# Patient Record
Sex: Female | Born: 1945 | Race: White | Hispanic: No | Marital: Married | Smoking: Never smoker
Health system: Southern US, Community
[De-identification: ages and names within clinical notes are randomized; demographics above are authoritative.]

## PROBLEM LIST (undated history)

## (undated) DIAGNOSIS — K219 Gastro-esophageal reflux disease without esophagitis: Secondary | ICD-10-CM

## (undated) DIAGNOSIS — L120 Bullous pemphigoid: Secondary | ICD-10-CM

## (undated) DIAGNOSIS — I10 Essential (primary) hypertension: Secondary | ICD-10-CM

## (undated) DIAGNOSIS — E0590 Thyrotoxicosis, unspecified without thyrotoxic crisis or storm: Secondary | ICD-10-CM

## (undated) DIAGNOSIS — R519 Headache, unspecified: Secondary | ICD-10-CM

## (undated) DIAGNOSIS — R112 Nausea with vomiting, unspecified: Secondary | ICD-10-CM

## (undated) DIAGNOSIS — Z9889 Other specified postprocedural states: Secondary | ICD-10-CM

## (undated) HISTORY — PX: NO PAST SURGERIES: SHX2092

---

## 1998-02-26 ENCOUNTER — Ambulatory Visit (HOSPITAL_COMMUNITY): Admission: RE | Admit: 1998-02-26 | Discharge: 1998-02-26 | Payer: Self-pay | Admitting: *Deleted

## 1998-06-16 ENCOUNTER — Other Ambulatory Visit: Admission: RE | Admit: 1998-06-16 | Discharge: 1998-06-16 | Payer: Self-pay | Admitting: *Deleted

## 1999-06-03 ENCOUNTER — Ambulatory Visit (HOSPITAL_COMMUNITY): Admission: RE | Admit: 1999-06-03 | Discharge: 1999-06-03 | Payer: Self-pay | Admitting: Obstetrics & Gynecology

## 1999-06-03 ENCOUNTER — Encounter: Payer: Self-pay | Admitting: Obstetrics & Gynecology

## 1999-06-16 ENCOUNTER — Other Ambulatory Visit: Admission: RE | Admit: 1999-06-16 | Discharge: 1999-06-16 | Payer: Self-pay | Admitting: Obstetrics & Gynecology

## 2000-06-20 ENCOUNTER — Encounter: Payer: Self-pay | Admitting: Emergency Medicine

## 2000-06-20 ENCOUNTER — Emergency Department (HOSPITAL_COMMUNITY): Admission: EM | Admit: 2000-06-20 | Discharge: 2000-06-20 | Payer: Self-pay | Admitting: Emergency Medicine

## 2000-06-30 ENCOUNTER — Other Ambulatory Visit: Admission: RE | Admit: 2000-06-30 | Discharge: 2000-06-30 | Payer: Self-pay | Admitting: Obstetrics & Gynecology

## 2000-08-01 ENCOUNTER — Ambulatory Visit (HOSPITAL_COMMUNITY): Admission: RE | Admit: 2000-08-01 | Discharge: 2000-08-01 | Payer: Self-pay | Admitting: Obstetrics & Gynecology

## 2000-08-01 ENCOUNTER — Encounter: Payer: Self-pay | Admitting: Obstetrics & Gynecology

## 2001-09-12 ENCOUNTER — Other Ambulatory Visit: Admission: RE | Admit: 2001-09-12 | Discharge: 2001-09-12 | Payer: Self-pay | Admitting: Obstetrics & Gynecology

## 2001-10-23 ENCOUNTER — Ambulatory Visit (HOSPITAL_COMMUNITY): Admission: RE | Admit: 2001-10-23 | Discharge: 2001-10-23 | Payer: Self-pay | Admitting: Obstetrics & Gynecology

## 2001-10-23 ENCOUNTER — Encounter: Payer: Self-pay | Admitting: Obstetrics & Gynecology

## 2002-10-12 ENCOUNTER — Other Ambulatory Visit: Admission: RE | Admit: 2002-10-12 | Discharge: 2002-10-12 | Payer: Self-pay | Admitting: Obstetrics & Gynecology

## 2003-01-03 ENCOUNTER — Encounter: Payer: Self-pay | Admitting: Obstetrics & Gynecology

## 2003-01-03 ENCOUNTER — Ambulatory Visit (HOSPITAL_COMMUNITY): Admission: RE | Admit: 2003-01-03 | Discharge: 2003-01-03 | Payer: Self-pay | Admitting: Obstetrics & Gynecology

## 2004-01-06 ENCOUNTER — Ambulatory Visit (HOSPITAL_COMMUNITY): Admission: RE | Admit: 2004-01-06 | Discharge: 2004-01-06 | Payer: Self-pay | Admitting: Obstetrics & Gynecology

## 2005-01-12 ENCOUNTER — Ambulatory Visit (HOSPITAL_COMMUNITY): Admission: RE | Admit: 2005-01-12 | Discharge: 2005-01-12 | Payer: Self-pay | Admitting: Obstetrics & Gynecology

## 2005-03-05 ENCOUNTER — Encounter: Admission: RE | Admit: 2005-03-05 | Discharge: 2005-03-05 | Payer: Self-pay | Admitting: Orthopedic Surgery

## 2006-01-14 ENCOUNTER — Ambulatory Visit (HOSPITAL_COMMUNITY): Admission: RE | Admit: 2006-01-14 | Discharge: 2006-01-14 | Payer: Self-pay | Admitting: Obstetrics & Gynecology

## 2007-01-18 ENCOUNTER — Ambulatory Visit: Admission: RE | Admit: 2007-01-18 | Discharge: 2007-01-18 | Payer: Self-pay | Admitting: Obstetrics & Gynecology

## 2008-01-24 ENCOUNTER — Ambulatory Visit (HOSPITAL_COMMUNITY): Admission: RE | Admit: 2008-01-24 | Discharge: 2008-01-24 | Payer: Self-pay | Admitting: Obstetrics & Gynecology

## 2009-01-24 ENCOUNTER — Ambulatory Visit (HOSPITAL_COMMUNITY): Admission: RE | Admit: 2009-01-24 | Discharge: 2009-01-24 | Payer: Self-pay | Admitting: Obstetrics & Gynecology

## 2009-02-04 ENCOUNTER — Encounter: Admission: RE | Admit: 2009-02-04 | Discharge: 2009-02-04 | Payer: Self-pay | Admitting: Obstetrics & Gynecology

## 2010-04-09 ENCOUNTER — Encounter
Admission: RE | Admit: 2010-04-09 | Discharge: 2010-04-09 | Payer: Self-pay | Source: Home / Self Care | Attending: Obstetrics & Gynecology | Admitting: Obstetrics & Gynecology

## 2011-04-20 ENCOUNTER — Other Ambulatory Visit: Payer: Self-pay | Admitting: Obstetrics & Gynecology

## 2011-04-20 DIAGNOSIS — Z1231 Encounter for screening mammogram for malignant neoplasm of breast: Secondary | ICD-10-CM

## 2011-05-05 ENCOUNTER — Ambulatory Visit
Admission: RE | Admit: 2011-05-05 | Discharge: 2011-05-05 | Disposition: A | Discharge status: Home / Self Care | Payer: BC Managed Care – PPO | Source: Ambulatory Visit | Attending: Obstetrics & Gynecology | Admitting: Obstetrics & Gynecology

## 2011-05-05 DIAGNOSIS — Z1231 Encounter for screening mammogram for malignant neoplasm of breast: Secondary | ICD-10-CM

## 2011-10-25 ENCOUNTER — Other Ambulatory Visit (HOSPITAL_COMMUNITY): Payer: Self-pay | Admitting: Family Medicine

## 2011-10-25 DIAGNOSIS — E0590 Thyrotoxicosis, unspecified without thyrotoxic crisis or storm: Secondary | ICD-10-CM

## 2011-11-02 ENCOUNTER — Encounter (HOSPITAL_COMMUNITY)
Admission: RE | Admit: 2011-11-02 | Discharge: 2011-11-02 | Disposition: A | Discharge status: Home / Self Care | Payer: BC Managed Care – PPO | Source: Ambulatory Visit | Attending: Family Medicine | Admitting: Family Medicine

## 2011-11-02 DIAGNOSIS — E0590 Thyrotoxicosis, unspecified without thyrotoxic crisis or storm: Secondary | ICD-10-CM | POA: Insufficient documentation

## 2011-11-02 DIAGNOSIS — G4700 Insomnia, unspecified: Secondary | ICD-10-CM | POA: Insufficient documentation

## 2011-11-02 DIAGNOSIS — R000 Tachycardia, unspecified: Secondary | ICD-10-CM | POA: Insufficient documentation

## 2011-11-03 ENCOUNTER — Encounter (HOSPITAL_COMMUNITY)
Admission: RE | Admit: 2011-11-03 | Discharge: 2011-11-03 | Disposition: A | Discharge status: Home / Self Care | Payer: BC Managed Care – PPO | Source: Ambulatory Visit | Attending: Family Medicine | Admitting: Family Medicine

## 2011-11-03 MED ORDER — SODIUM PERTECHNETATE TC 99M INJECTION
10.6000 | Freq: Once | INTRAVENOUS | Status: AC | PRN
  Administered 2011-11-03: 11 via INTRAVENOUS

## 2011-11-03 MED ORDER — SODIUM IODIDE I 131 CAPSULE
10.6000 | Freq: Once | INTRAVENOUS | Status: AC | PRN
  Administered 2011-11-03: 10.6 via ORAL

## 2012-01-05 ENCOUNTER — Other Ambulatory Visit: Payer: Self-pay | Admitting: Endocrinology

## 2012-01-05 DIAGNOSIS — E0590 Thyrotoxicosis, unspecified without thyrotoxic crisis or storm: Secondary | ICD-10-CM

## 2012-01-05 DIAGNOSIS — E042 Nontoxic multinodular goiter: Secondary | ICD-10-CM

## 2012-01-14 ENCOUNTER — Ambulatory Visit (HOSPITAL_COMMUNITY)
Admission: RE | Admit: 2012-01-14 | Discharge: 2012-01-14 | Disposition: A | Discharge status: Home / Self Care | Payer: BC Managed Care – PPO | Source: Ambulatory Visit | Attending: Endocrinology | Admitting: Endocrinology

## 2012-01-14 DIAGNOSIS — E0590 Thyrotoxicosis, unspecified without thyrotoxic crisis or storm: Secondary | ICD-10-CM | POA: Insufficient documentation

## 2012-01-14 DIAGNOSIS — E042 Nontoxic multinodular goiter: Secondary | ICD-10-CM | POA: Insufficient documentation

## 2012-04-24 DIAGNOSIS — E0590 Thyrotoxicosis, unspecified without thyrotoxic crisis or storm: Secondary | ICD-10-CM | POA: Diagnosis not present

## 2012-04-26 DIAGNOSIS — E042 Nontoxic multinodular goiter: Secondary | ICD-10-CM | POA: Diagnosis not present

## 2012-04-26 DIAGNOSIS — E0590 Thyrotoxicosis, unspecified without thyrotoxic crisis or storm: Secondary | ICD-10-CM | POA: Diagnosis not present

## 2012-05-22 DIAGNOSIS — L80 Vitiligo: Secondary | ICD-10-CM | POA: Diagnosis not present

## 2012-05-30 ENCOUNTER — Other Ambulatory Visit: Payer: Self-pay

## 2012-05-30 DIAGNOSIS — Z1231 Encounter for screening mammogram for malignant neoplasm of breast: Secondary | ICD-10-CM

## 2012-06-19 DIAGNOSIS — M79609 Pain in unspecified limb: Secondary | ICD-10-CM | POA: Diagnosis not present

## 2012-06-19 DIAGNOSIS — Z1331 Encounter for screening for depression: Secondary | ICD-10-CM | POA: Diagnosis not present

## 2012-06-19 DIAGNOSIS — I10 Essential (primary) hypertension: Secondary | ICD-10-CM | POA: Diagnosis not present

## 2012-06-20 ENCOUNTER — Ambulatory Visit
Admission: RE | Admit: 2012-06-20 | Discharge: 2012-06-20 | Disposition: A | Discharge status: Home / Self Care | Payer: Medicare Other | Source: Ambulatory Visit

## 2012-06-20 DIAGNOSIS — Z1231 Encounter for screening mammogram for malignant neoplasm of breast: Secondary | ICD-10-CM

## 2012-07-25 DIAGNOSIS — E0590 Thyrotoxicosis, unspecified without thyrotoxic crisis or storm: Secondary | ICD-10-CM | POA: Diagnosis not present

## 2012-12-06 DIAGNOSIS — E0590 Thyrotoxicosis, unspecified without thyrotoxic crisis or storm: Secondary | ICD-10-CM | POA: Diagnosis not present

## 2012-12-08 DIAGNOSIS — E042 Nontoxic multinodular goiter: Secondary | ICD-10-CM | POA: Diagnosis not present

## 2012-12-08 DIAGNOSIS — E0590 Thyrotoxicosis, unspecified without thyrotoxic crisis or storm: Secondary | ICD-10-CM | POA: Diagnosis not present

## 2012-12-08 DIAGNOSIS — E0500 Thyrotoxicosis with diffuse goiter without thyrotoxic crisis or storm: Secondary | ICD-10-CM | POA: Diagnosis not present

## 2012-12-19 DIAGNOSIS — J387 Other diseases of larynx: Secondary | ICD-10-CM | POA: Diagnosis not present

## 2012-12-19 DIAGNOSIS — Z23 Encounter for immunization: Secondary | ICD-10-CM | POA: Diagnosis not present

## 2012-12-19 DIAGNOSIS — I10 Essential (primary) hypertension: Secondary | ICD-10-CM | POA: Diagnosis not present

## 2012-12-19 DIAGNOSIS — Z733 Stress, not elsewhere classified: Secondary | ICD-10-CM | POA: Diagnosis not present

## 2012-12-19 DIAGNOSIS — Z1211 Encounter for screening for malignant neoplasm of colon: Secondary | ICD-10-CM | POA: Diagnosis not present

## 2013-02-09 DIAGNOSIS — Z124 Encounter for screening for malignant neoplasm of cervix: Secondary | ICD-10-CM | POA: Diagnosis not present

## 2013-02-09 DIAGNOSIS — Z01419 Encounter for gynecological examination (general) (routine) without abnormal findings: Secondary | ICD-10-CM | POA: Diagnosis not present

## 2013-02-12 DIAGNOSIS — R197 Diarrhea, unspecified: Secondary | ICD-10-CM | POA: Diagnosis not present

## 2013-02-12 DIAGNOSIS — K589 Irritable bowel syndrome without diarrhea: Secondary | ICD-10-CM | POA: Diagnosis not present

## 2013-02-12 DIAGNOSIS — K5289 Other specified noninfective gastroenteritis and colitis: Secondary | ICD-10-CM | POA: Diagnosis not present

## 2013-02-12 DIAGNOSIS — Z1211 Encounter for screening for malignant neoplasm of colon: Secondary | ICD-10-CM | POA: Diagnosis not present

## 2013-02-27 DIAGNOSIS — R197 Diarrhea, unspecified: Secondary | ICD-10-CM | POA: Diagnosis not present

## 2013-03-30 DIAGNOSIS — K5289 Other specified noninfective gastroenteritis and colitis: Secondary | ICD-10-CM | POA: Diagnosis not present

## 2013-04-09 DIAGNOSIS — E0590 Thyrotoxicosis, unspecified without thyrotoxic crisis or storm: Secondary | ICD-10-CM | POA: Diagnosis not present

## 2013-04-09 DIAGNOSIS — L282 Other prurigo: Secondary | ICD-10-CM | POA: Diagnosis not present

## 2013-04-25 DIAGNOSIS — B86 Scabies: Secondary | ICD-10-CM | POA: Diagnosis not present

## 2013-05-25 DIAGNOSIS — H268 Other specified cataract: Secondary | ICD-10-CM | POA: Diagnosis not present

## 2013-05-25 DIAGNOSIS — H2510 Age-related nuclear cataract, unspecified eye: Secondary | ICD-10-CM | POA: Diagnosis not present

## 2013-05-25 DIAGNOSIS — H16429 Pannus (corneal), unspecified eye: Secondary | ICD-10-CM | POA: Diagnosis not present

## 2013-05-25 DIAGNOSIS — H18419 Arcus senilis, unspecified eye: Secondary | ICD-10-CM | POA: Diagnosis not present

## 2013-07-04 DIAGNOSIS — I10 Essential (primary) hypertension: Secondary | ICD-10-CM | POA: Diagnosis not present

## 2013-07-04 DIAGNOSIS — G43909 Migraine, unspecified, not intractable, without status migrainosus: Secondary | ICD-10-CM | POA: Diagnosis not present

## 2013-07-04 DIAGNOSIS — Z0000 Encounter for general adult medical examination without abnormal findings: Secondary | ICD-10-CM | POA: Diagnosis not present

## 2013-07-04 DIAGNOSIS — K219 Gastro-esophageal reflux disease without esophagitis: Secondary | ICD-10-CM | POA: Diagnosis not present

## 2013-07-19 ENCOUNTER — Other Ambulatory Visit: Payer: Self-pay | Admitting: Family Medicine

## 2013-07-19 ENCOUNTER — Ambulatory Visit
Admission: RE | Admit: 2013-07-19 | Discharge: 2013-07-19 | Disposition: A | Discharge status: Home / Self Care | Payer: Medicare Other | Source: Ambulatory Visit | Attending: Family Medicine | Admitting: Family Medicine

## 2013-07-19 ENCOUNTER — Emergency Department (HOSPITAL_COMMUNITY): Payer: Medicare Other | Admitting: Certified Registered Nurse Anesthetist

## 2013-07-19 ENCOUNTER — Encounter (HOSPITAL_COMMUNITY): Payer: Self-pay | Admitting: Emergency Medicine

## 2013-07-19 ENCOUNTER — Encounter (HOSPITAL_COMMUNITY)
Admission: EM | Disposition: A | Discharge status: Home / Self Care | Payer: Self-pay | Source: Home / Self Care | Attending: Surgery

## 2013-07-19 ENCOUNTER — Encounter (HOSPITAL_COMMUNITY): Payer: Medicare Other | Admitting: Certified Registered Nurse Anesthetist

## 2013-07-19 ENCOUNTER — Inpatient Hospital Stay (HOSPITAL_COMMUNITY)
Admission: EM | Admit: 2013-07-19 | Discharge: 2013-07-21 | DRG: 340 | Disposition: A | Discharge status: Home / Self Care | Payer: Medicare Other | Attending: Surgery | Admitting: Surgery

## 2013-07-19 DIAGNOSIS — K3532 Acute appendicitis with perforation and localized peritonitis, without abscess: Secondary | ICD-10-CM | POA: Diagnosis present

## 2013-07-19 DIAGNOSIS — K37 Unspecified appendicitis: Secondary | ICD-10-CM

## 2013-07-19 DIAGNOSIS — Z881 Allergy status to other antibiotic agents status: Secondary | ICD-10-CM | POA: Diagnosis not present

## 2013-07-19 DIAGNOSIS — G43909 Migraine, unspecified, not intractable, without status migrainosus: Secondary | ICD-10-CM | POA: Diagnosis not present

## 2013-07-19 DIAGNOSIS — K35209 Acute appendicitis with generalized peritonitis, without abscess, unspecified as to perforation: Principal | ICD-10-CM | POA: Diagnosis present

## 2013-07-19 DIAGNOSIS — K219 Gastro-esophageal reflux disease without esophagitis: Secondary | ICD-10-CM | POA: Diagnosis present

## 2013-07-19 DIAGNOSIS — K3580 Unspecified acute appendicitis: Secondary | ICD-10-CM | POA: Diagnosis present

## 2013-07-19 DIAGNOSIS — R10819 Abdominal tenderness, unspecified site: Secondary | ICD-10-CM | POA: Diagnosis not present

## 2013-07-19 DIAGNOSIS — I10 Essential (primary) hypertension: Secondary | ICD-10-CM | POA: Diagnosis present

## 2013-07-19 DIAGNOSIS — R1031 Right lower quadrant pain: Secondary | ICD-10-CM | POA: Diagnosis not present

## 2013-07-19 DIAGNOSIS — Z79899 Other long term (current) drug therapy: Secondary | ICD-10-CM | POA: Diagnosis not present

## 2013-07-19 DIAGNOSIS — K352 Acute appendicitis with generalized peritonitis, without abscess: Secondary | ICD-10-CM | POA: Diagnosis not present

## 2013-07-19 DIAGNOSIS — R109 Unspecified abdominal pain: Secondary | ICD-10-CM | POA: Diagnosis not present

## 2013-07-19 DIAGNOSIS — E0590 Thyrotoxicosis, unspecified without thyrotoxic crisis or storm: Secondary | ICD-10-CM | POA: Diagnosis not present

## 2013-07-19 DIAGNOSIS — E039 Hypothyroidism, unspecified: Secondary | ICD-10-CM | POA: Diagnosis not present

## 2013-07-19 DIAGNOSIS — K3533 Acute appendicitis with perforation and localized peritonitis, with abscess: Secondary | ICD-10-CM | POA: Diagnosis present

## 2013-07-19 HISTORY — PX: LAPAROSCOPIC APPENDECTOMY: SHX408

## 2013-07-19 HISTORY — DX: Essential (primary) hypertension: I10

## 2013-07-19 HISTORY — DX: Thyrotoxicosis, unspecified without thyrotoxic crisis or storm: E05.90

## 2013-07-19 LAB — COMPREHENSIVE METABOLIC PANEL
ALT: 74 U/L — ABNORMAL HIGH (ref 0–35)
AST: 86 U/L — ABNORMAL HIGH (ref 0–37)
Albumin: 3.9 g/dL (ref 3.5–5.2)
Alkaline Phosphatase: 104 U/L (ref 39–117)
BILIRUBIN TOTAL: 2.1 mg/dL — AB (ref 0.3–1.2)
BUN: 9 mg/dL (ref 6–23)
CHLORIDE: 89 meq/L — AB (ref 96–112)
CO2: 25 meq/L (ref 19–32)
CREATININE: 0.63 mg/dL (ref 0.50–1.10)
Calcium: 9.7 mg/dL (ref 8.4–10.5)
GFR calc non Af Amer: >90 mL/min (ref >90)
GLUCOSE: 125 mg/dL — AB (ref 70–99)
Potassium: 4.3 mEq/L (ref 3.7–5.3)
Sodium: 131 mEq/L — ABNORMAL LOW (ref 137–147)
Total Protein: 7.7 g/dL (ref 6.0–8.3)

## 2013-07-19 LAB — CBC WITH DIFFERENTIAL/PLATELET
Basophils Absolute: 0 10*3/uL (ref 0.0–0.1)
Basophils Relative: 0 % (ref 0–1)
Eosinophils Absolute: 0 10*3/uL (ref 0.0–0.7)
Eosinophils Relative: 0 % (ref 0–5)
HEMATOCRIT: 43.7 % (ref 36.0–46.0)
HEMOGLOBIN: 15.1 g/dL — AB (ref 12.0–15.0)
Lymphocytes Relative: 3 % — ABNORMAL LOW (ref 12–46)
Lymphs Abs: 0.6 10*3/uL — ABNORMAL LOW (ref 0.7–4.0)
MCH: 32.1 pg (ref 26.0–34.0)
MCHC: 34.6 g/dL (ref 30.0–36.0)
MCV: 93 fL (ref 78.0–100.0)
MONO ABS: 1.4 10*3/uL — AB (ref 0.1–1.0)
MONOS PCT: 7 % (ref 3–12)
Neutro Abs: 17.4 10*3/uL — ABNORMAL HIGH (ref 1.7–7.7)
Neutrophils Relative %: 90 % — ABNORMAL HIGH (ref 43–77)
Platelets: 274 10*3/uL (ref 150–400)
RBC: 4.7 MIL/uL (ref 3.87–5.11)
RDW: 12.7 % (ref 11.5–15.5)
WBC: 19.5 10*3/uL — ABNORMAL HIGH (ref 4.0–10.5)

## 2013-07-19 SURGERY — APPENDECTOMY, LAPAROSCOPIC
Anesthesia: General | Site: Abdomen

## 2013-07-19 MED ORDER — BUPIVACAINE-EPINEPHRINE (PF) 0.25% -1:200000 IJ SOLN
INTRAMUSCULAR | Status: AC
  Filled 2013-07-19: qty 30

## 2013-07-19 MED ORDER — SODIUM CHLORIDE 0.9 % IV SOLN
3.0000 g | Freq: Four times a day (QID) | INTRAVENOUS | Status: DC
  Administered 2013-07-19 – 2013-07-21 (×6): 3 g via INTRAVENOUS
  Filled 2013-07-19 (×9): qty 3

## 2013-07-19 MED ORDER — FENTANYL CITRATE 0.05 MG/ML IJ SOLN
INTRAMUSCULAR | Status: AC
  Filled 2013-07-19: qty 5

## 2013-07-19 MED ORDER — ENOXAPARIN SODIUM 40 MG/0.4ML ~~LOC~~ SOLN
40.0000 mg | SUBCUTANEOUS | Status: DC
  Administered 2013-07-20: 40 mg via SUBCUTANEOUS
  Filled 2013-07-19 (×2): qty 0.4

## 2013-07-19 MED ORDER — GLYCOPYRROLATE 0.2 MG/ML IJ SOLN
INTRAMUSCULAR | Status: AC
  Filled 2013-07-19: qty 2

## 2013-07-19 MED ORDER — LACTATED RINGERS IR SOLN
Status: DC | PRN
  Administered 2013-07-19: 1000 mL

## 2013-07-19 MED ORDER — SUCCINYLCHOLINE CHLORIDE 20 MG/ML IJ SOLN
INTRAMUSCULAR | Status: DC | PRN
  Administered 2013-07-19: 80 mg via INTRAVENOUS

## 2013-07-19 MED ORDER — POTASSIUM CHLORIDE IN NACL 20-0.9 MEQ/L-% IV SOLN
INTRAVENOUS | Status: DC
  Filled 2013-07-19 (×2): qty 1000

## 2013-07-19 MED ORDER — LIDOCAINE HCL (CARDIAC) 20 MG/ML IV SOLN
INTRAVENOUS | Status: AC
  Filled 2013-07-19: qty 5

## 2013-07-19 MED ORDER — SODIUM CHLORIDE 0.9 % IV SOLN
3.0000 g | INTRAVENOUS | Status: AC
  Administered 2013-07-19: 3 g via INTRAVENOUS
  Filled 2013-07-19: qty 3

## 2013-07-19 MED ORDER — DEXAMETHASONE SODIUM PHOSPHATE 10 MG/ML IJ SOLN
INTRAMUSCULAR | Status: DC | PRN
  Administered 2013-07-19: 10 mg via INTRAVENOUS

## 2013-07-19 MED ORDER — BUPIVACAINE-EPINEPHRINE 0.25% -1:200000 IJ SOLN
INTRAMUSCULAR | Status: DC | PRN
  Administered 2013-07-19: 20 mL

## 2013-07-19 MED ORDER — MIDAZOLAM HCL 2 MG/2ML IJ SOLN
INTRAMUSCULAR | Status: AC
  Filled 2013-07-19: qty 2

## 2013-07-19 MED ORDER — MIDAZOLAM HCL 5 MG/5ML IJ SOLN
INTRAMUSCULAR | Status: DC | PRN
  Administered 2013-07-19: 2 mg via INTRAVENOUS

## 2013-07-19 MED ORDER — ONDANSETRON HCL 4 MG/2ML IJ SOLN
INTRAMUSCULAR | Status: AC
  Filled 2013-07-19: qty 2

## 2013-07-19 MED ORDER — CISATRACURIUM BESYLATE (PF) 10 MG/5ML IV SOLN
INTRAVENOUS | Status: DC | PRN
  Administered 2013-07-19: 4 mg via INTRAVENOUS

## 2013-07-19 MED ORDER — PROPOFOL 10 MG/ML IV BOLUS
INTRAVENOUS | Status: DC | PRN
  Administered 2013-07-19: 120 mg via INTRAVENOUS

## 2013-07-19 MED ORDER — ONDANSETRON HCL 4 MG/2ML IJ SOLN
INTRAMUSCULAR | Status: DC | PRN
  Administered 2013-07-19: 4 mg via INTRAVENOUS

## 2013-07-19 MED ORDER — HYDROMORPHONE HCL PF 1 MG/ML IJ SOLN: 1.0000 mg | INTRAMUSCULAR | Status: DC | PRN

## 2013-07-19 MED ORDER — FENTANYL CITRATE 0.05 MG/ML IJ SOLN
INTRAMUSCULAR | Status: DC | PRN
  Administered 2013-07-19 (×2): 50 ug via INTRAVENOUS
  Administered 2013-07-19: 100 ug via INTRAVENOUS
  Administered 2013-07-19: 50 ug via INTRAVENOUS

## 2013-07-19 MED ORDER — 0.9 % SODIUM CHLORIDE (POUR BTL) OPTIME
TOPICAL | Status: DC | PRN
  Administered 2013-07-19: 1000 mL

## 2013-07-19 MED ORDER — IOHEXOL 300 MG/ML  SOLN
100.0000 mL | Freq: Once | INTRAMUSCULAR | Status: AC | PRN
  Administered 2013-07-19: 100 mL via INTRAVENOUS

## 2013-07-19 MED ORDER — IBUPROFEN 600 MG PO TABS
600.0000 mg | ORAL_TABLET | Freq: Four times a day (QID) | ORAL | Status: DC | PRN
  Administered 2013-07-21: 600 mg via ORAL
  Filled 2013-07-19 (×2): qty 1

## 2013-07-19 MED ORDER — ONDANSETRON HCL 4 MG/2ML IJ SOLN
4.0000 mg | Freq: Once | INTRAMUSCULAR | Status: AC
  Administered 2013-07-19: 4 mg via INTRAVENOUS
  Filled 2013-07-19: qty 2

## 2013-07-19 MED ORDER — LIDOCAINE HCL (PF) 2 % IJ SOLN
INTRAMUSCULAR | Status: DC | PRN
  Administered 2013-07-19: 50 mg via INTRADERMAL

## 2013-07-19 MED ORDER — HYDROCODONE-ACETAMINOPHEN 5-325 MG PO TABS
1.0000 | ORAL_TABLET | ORAL | Status: DC | PRN
  Administered 2013-07-20 (×3): 1 via ORAL
  Administered 2013-07-20 – 2013-07-21 (×2): 2 via ORAL
  Filled 2013-07-19: qty 2
  Filled 2013-07-19 (×3): qty 1
  Filled 2013-07-19: qty 2
  Filled 2013-07-19: qty 1

## 2013-07-19 MED ORDER — HYDROMORPHONE HCL PF 1 MG/ML IJ SOLN
1.0000 mg | INTRAMUSCULAR | Status: DC | PRN
  Administered 2013-07-19: 1 mg via INTRAVENOUS
  Filled 2013-07-19: qty 1

## 2013-07-19 MED ORDER — GLYCOPYRROLATE 0.2 MG/ML IJ SOLN
INTRAMUSCULAR | Status: DC | PRN
  Administered 2013-07-19: 0.4 mg via INTRAVENOUS

## 2013-07-19 MED ORDER — CISATRACURIUM BESYLATE 20 MG/10ML IV SOLN
INTRAVENOUS | Status: AC
  Filled 2013-07-19: qty 10

## 2013-07-19 MED ORDER — SODIUM CHLORIDE 0.9 % IV SOLN
1000.0000 mL | Freq: Once | INTRAVENOUS | Status: AC
  Administered 2013-07-19: 1000 mL via INTRAVENOUS

## 2013-07-19 MED ORDER — PROPOFOL 10 MG/ML IV BOLUS
INTRAVENOUS | Status: AC
  Filled 2013-07-19: qty 20

## 2013-07-19 MED ORDER — NEOSTIGMINE METHYLSULFATE 10 MG/10ML IV SOLN
INTRAVENOUS | Status: DC | PRN
  Administered 2013-07-19: 3 mg via INTRAVENOUS

## 2013-07-19 MED ORDER — KCL IN DEXTROSE-NACL 30-5-0.45 MEQ/L-%-% IV SOLN
INTRAVENOUS | Status: DC
  Administered 2013-07-19: 75 mL via INTRAVENOUS
  Filled 2013-07-19 (×4): qty 1000

## 2013-07-19 MED ORDER — ONDANSETRON HCL 4 MG PO TABS
4.0000 mg | ORAL_TABLET | Freq: Four times a day (QID) | ORAL | Status: DC | PRN
  Administered 2013-07-20 – 2013-07-21 (×2): 4 mg via ORAL
  Filled 2013-07-19 (×2): qty 1

## 2013-07-19 MED ORDER — ONDANSETRON HCL 4 MG/2ML IJ SOLN: 4.0000 mg | Freq: Four times a day (QID) | INTRAMUSCULAR | Status: DC | PRN

## 2013-07-19 MED ORDER — HYDROMORPHONE HCL PF 1 MG/ML IJ SOLN
1.0000 mg | Freq: Once | INTRAMUSCULAR | Status: AC
  Administered 2013-07-19: 1 mg via INTRAVENOUS
  Filled 2013-07-19: qty 1

## 2013-07-19 MED ORDER — DEXAMETHASONE SODIUM PHOSPHATE 10 MG/ML IJ SOLN
INTRAMUSCULAR | Status: AC
  Filled 2013-07-19: qty 1

## 2013-07-19 MED ORDER — LACTATED RINGERS IV SOLN
INTRAVENOUS | Status: DC | PRN
  Administered 2013-07-19: 17:00:00 via INTRAVENOUS

## 2013-07-19 MED ORDER — ONDANSETRON HCL 4 MG/2ML IJ SOLN
4.0000 mg | Freq: Four times a day (QID) | INTRAMUSCULAR | Status: DC | PRN
  Administered 2013-07-19: 4 mg via INTRAVENOUS
  Filled 2013-07-19: qty 2

## 2013-07-19 SURGICAL SUPPLY — 40 items
APL SKNCLS STERI-STRIP NONHPOA (GAUZE/BANDAGES/DRESSINGS) ×1
APPLIER CLIP ROT 10 11.4 M/L (STAPLE)
APR CLP MED LRG 11.4X10 (STAPLE)
BAG SPEC RTRVL LRG 6X4 10 (ENDOMECHANICALS) ×1
BENZOIN TINCTURE PRP APPL 2/3 (GAUZE/BANDAGES/DRESSINGS) ×3 IMPLANT
CANISTER SUCTION 2500CC (MISCELLANEOUS) ×3 IMPLANT
CLIP APPLIE ROT 10 11.4 M/L (STAPLE) IMPLANT
CLOSURE WOUND 1/2 X4 (GAUZE/BANDAGES/DRESSINGS) ×1
CUTTER FLEX LINEAR 45M (STAPLE) ×2 IMPLANT
DECANTER SPIKE VIAL GLASS SM (MISCELLANEOUS) ×1 IMPLANT
DRAPE LAPAROSCOPIC ABDOMINAL (DRAPES) ×3 IMPLANT
ELECT REM PT RETURN 9FT ADLT (ELECTROSURGICAL) ×3
ELECTRODE REM PT RTRN 9FT ADLT (ELECTROSURGICAL) ×1 IMPLANT
ENDOLOOP SUT PDS II  0 18 (SUTURE)
ENDOLOOP SUT PDS II 0 18 (SUTURE) IMPLANT
GLOVE BIOGEL PI IND STRL 7.0 (GLOVE) ×1 IMPLANT
GLOVE BIOGEL PI INDICATOR 7.0 (GLOVE) ×2
GLOVE SURG ORTHO 8.0 STRL STRW (GLOVE) ×3 IMPLANT
GOWN STRL REUS W/TWL LRG LVL3 (GOWN DISPOSABLE) ×3 IMPLANT
GOWN STRL REUS W/TWL XL LVL3 (GOWN DISPOSABLE) ×6 IMPLANT
KIT BASIN OR (CUSTOM PROCEDURE TRAY) ×3 IMPLANT
PENCIL BUTTON HOLSTER BLD 10FT (ELECTRODE) IMPLANT
POUCH SPECIMEN RETRIEVAL 10MM (ENDOMECHANICALS) ×2 IMPLANT
RELOAD 45 VASCULAR/THIN (ENDOMECHANICALS) IMPLANT
RELOAD STAPLE 45 2.5 WHT GRN (ENDOMECHANICALS) IMPLANT
RELOAD STAPLE 45 3.5 BLU ETS (ENDOMECHANICALS) IMPLANT
RELOAD STAPLE TA45 3.5 REG BLU (ENDOMECHANICALS) ×3 IMPLANT
SCALPEL HARMONIC ACE (MISCELLANEOUS) ×2 IMPLANT
SET IRRIG TUBING LAPAROSCOPIC (IRRIGATION / IRRIGATOR) ×2 IMPLANT
SOLUTION ANTI FOG 6CC (MISCELLANEOUS) ×3 IMPLANT
STRIP CLOSURE SKIN 1/2X4 (GAUZE/BANDAGES/DRESSINGS) ×2 IMPLANT
SUT MNCRL AB 4-0 PS2 18 (SUTURE) ×3 IMPLANT
TOWEL OR 17X26 10 PK STRL BLUE (TOWEL DISPOSABLE) ×3 IMPLANT
TRAY FOLEY CATH 14FRSI W/METER (CATHETERS) ×3 IMPLANT
TRAY LAP CHOLE (CUSTOM PROCEDURE TRAY) ×3 IMPLANT
TROCAR BLADELESS OPT 5 75 (ENDOMECHANICALS) ×3 IMPLANT
TROCAR XCEL BLUNT TIP 100MML (ENDOMECHANICALS) ×3 IMPLANT
TROCAR XCEL NON-BLD 11X100MML (ENDOMECHANICALS) ×3 IMPLANT
TUBING INSUFFLATION 10FT LAP (TUBING) ×3 IMPLANT
WATER STERILE IRR 1500ML POUR (IV SOLUTION) ×3 IMPLANT

## 2013-07-19 NOTE — Anesthesia Preprocedure Evaluation (Signed)
Anesthesia Evaluation  Patient identified by MRN, date of birth, ID band Patient awake    Reviewed: Allergy & Precautions, H&P , NPO status , Patient's Chart, lab work & pertinent test results  Airway Mallampati: II TM Distance: >3 FB Neck ROM: Full    Dental no notable dental hx.    Pulmonary neg pulmonary ROS,  breath sounds clear to auscultation  Pulmonary exam normal       Cardiovascular Exercise Tolerance: Good hypertension, Pt. on medications Rhythm:Regular Rate:Normal     Neuro/Psych negative neurological ROS  negative psych ROS   GI/Hepatic negative GI ROS, Neg liver ROS,   Endo/Other  Hyperthyroidism   Renal/GU negative Renal ROS  negative genitourinary   Musculoskeletal negative musculoskeletal ROS (+)   Abdominal   Peds negative pediatric ROS (+)  Hematology negative hematology ROS (+)   Anesthesia Other Findings   Reproductive/Obstetrics negative OB ROS                           Anesthesia Physical Anesthesia Plan  ASA: II and emergent  Anesthesia Plan: General   Post-op Pain Management:    Induction: Intravenous  Airway Management Planned: Oral ETT  Additional Equipment:   Intra-op Plan:   Post-operative Plan: Extubation in OR  Informed Consent: I have reviewed the patients History and Physical, chart, labs and discussed the procedure including the risks, benefits and alternatives for the proposed anesthesia with the patient or authorized representative who has indicated his/her understanding and acceptance.   Dental advisory given  Plan Discussed with: CRNA  Anesthesia Plan Comments:         Anesthesia Quick Evaluation

## 2013-07-19 NOTE — Transfer of Care (Signed)
Immediate Anesthesia Transfer of Care Note  Patient: Julia Velez  Procedure(s) Performed: Procedure(s) (LRB): APPENDECTOMY LAPAROSCOPIC (N/A)  Patient Location: PACU  Anesthesia Type: General  Level of Consciousness: sedated, patient cooperative and responds to stimulation  Airway & Oxygen Therapy: Patient Spontanous Breathing and Patient connected to face mask oxgen  Post-op Assessment: Report given to PACU RN and Post -op Vital signs reviewed and stable  Post vital signs: Reviewed and stable  Complications: No apparent anesthesia complications

## 2013-07-19 NOTE — Op Note (Signed)
OPERATIVE REPORT - LAPAROSCOPIC APPENDECTOMY  Preop diagnosis: Acute appendicitis  Postop diagnosis: Acute appendicitis with perforation and peritonitis   Procedure: Laparoscopic appendectomy  Surgeon:  Earnstine Regal, MD, FACS  Anesthesia: General endotracheal  Estimated blood loss: Minimal  Preparation: Chlora-prep  Complications: None  Indications:  Patient is a 68 yo WF who is referred by her primary physician, Dr. Carita Pian, with signs and symptoms of appendicitis.  CT scan positive for appendicitis.  WBC elevated at 19K.  Now for appendectomy.  Procedure:  Patient is brought to the operating room and placed in a supine position on the operating room table. Following administration of general anesthesia, a time out was held and the patient's name and procedure is confirmed. Patient is then prepped and draped in the usual strict aseptic fashion.  After ascertaining that an adequate level of anesthesia has been achieved, a peri-umbilical incision is made with a #15 blade. Dissection is carried down to the fascia. Fascia is incised in the midline and the peritoneal cavity is entered cautiously. A #0-vicryl pursestring suture is placed in the fascia. An Hassan cannula is introduced under direct vision and secured with the pursestring suture. The abdomen is insufflated with carbon dioxide. The laparoscope is introduced and the abdomen is explored. Operative ports are placed in the right upper quadrant and left lower quadrant. The appendix is identified. It is adherent to the mesentery of the terminal ileum.  There is gross perforation with purulent fluid present adjacent to the appendix in the right lower quadrant.  There is peritoneal inflammation. The mesoappendix is divided with the harmonic scalpel. Dissection is carried down to the base of the appendix. The base of the appendix is dissected out clearing the junction with the cecal wall. Using an Endo-GIA stapler, the base of the  appendix is transected at the junction with the cecal wall. There is good approximation of tissue along the staple line. There is good hemostasis along the staple line. The appendix is placed into an endo-catch bag and withdrawn through the umbilical port. The #0-vicryl pursestring suture is tied securely.  Right lower quadrant is irrigated with warm saline which is evacuated. Good hemostasis is noted. Ports are removed under direct vision. Good hemostasis is noted at the port sites. Pneumoperitoneum is released.  Skin incisions are anesthetized with local anesthetic. Wounds are closed with interrupted 4-0 Monocryl subcuticular sutures. Wounds are washed and dried and benzoin and Steri-Strips are applied. Dressings are applied. The patient is awakened from anesthesia and brought to the recovery room. The patient tolerated the procedure well.  Earnstine Regal, MD, [facility] Office: [phone]

## 2013-07-19 NOTE — Consult Note (Signed)
Chief complaint:  Abdominal pain Referring Physician: Vidal Schwalbe, MD   Julia Velez is an 68 y.o. female.  HPI: Healthy female who started having pain last night about 5:30 PM.  Pain lasted all night and she could not get comfort from anything .  She saw her PCP this Am and CT scan shows acute appendicitis.  She was referred to the [facility].  Labs show elevated WBC 19.5.  CMP is pending.  She has been seen by Dr. Harlow Asa and we plan appendectomy this evening.  Past Medical History  Diagnosis Date  Hypertension   Migraine   GERD   Hyperthyroidism     Past Surgical History  Procedure Laterality Date  . No past surgeries      History reviewed. No pertinent family history.  Social History:  reports that she has never smoked. She has never used smokeless tobacco. She reports that she drinks alcohol. She reports that she does not use illicit drugs.  Allergies:  Allergies  Allergen Reactions  . 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 Sulfa Antibiotics Rash    Medications:  Prior to Admission medications   Medication Sig Start Date End Date Taking? Authorizing Provider  calcium carbonate (OS-CAL - DOSED IN MG OF ELEMENTAL CALCIUM) 1250 MG tablet Take 1 tablet by mouth daily with breakfast.   Yes Historical Provider, MD  cholecalciferol (VITAMIN D) 1000 UNITS tablet Take 1,000 Units by mouth daily.   Yes Historical Provider, MD  Cinnamon 500 MG capsule Take 500 mg by mouth daily.   Yes Historical Provider, MD  ranitidine (ZANTAC) 150 MG tablet Take 150 mg by mouth every morning.   Yes Historical Provider, MD  SUMAtriptan (IMITREX) 25 MG tablet Take 25 mg by mouth every 2 (two) hours as needed for migraine or headache. May repeat in 2 hours if headache persists or recurs.   Yes Historical Provider, MD  triamterene-hydrochlorothiazide (MAXZIDE-25) 37.5-25 MG per tablet Take 1 tablet by mouth daily.   Yes Historical Provider, MD  vitamin B-12 (CYANOCOBALAMIN)  1000 MCG tablet Take 1,000 mcg by mouth daily.   Yes Historical Provider, MD    Prior to Admission: Continuous: . sodium chloride 1,000 mL (07/19/13 1555)  . ampicillin-sulbactam (UNASYN) IV     PRN: Anti-infectives   Start     Dose/Rate Route Frequency Ordered Stop   07/19/13 1630  Ampicillin-Sulbactam (UNASYN) 3 g in sodium chloride 0.9 % 100 mL IVPB     3 g 100 mL/hr over 60 Minutes Intravenous NOW 07/19/13 1625 07/20/13 1630      Results for orders placed during the hospital encounter of 07/19/13 (from the past 48 hour(s))  CBC WITH DIFFERENTIAL     Status: Abnormal   Collection Time    07/19/13  3:50 PM      Result Value Ref Range   WBC 19.5 (*) 4.0 - 10.5 K/uL   RBC 4.70  3.87 - 5.11 MIL/uL   Hemoglobin 15.1 (*) 12.0 - 15.0 g/dL   HCT 43.7  36.0 - 46.0 %   MCV 93.0  78.0 - 100.0 fL   MCH 32.1  26.0 - 34.0 pg   MCHC 34.6  30.0 - 36.0 g/dL   RDW 12.7  11.5 - 15.5 %   Platelets 274  150 - 400 K/uL   Neutrophils Relative % 90 (*) 43 - 77 %   Neutro Abs 17.4 (*) 1.7 - 7.7 K/uL  Lymphocytes Relative 3 (*) 12 - 46 %   Lymphs Abs 0.6 (*) 0.7 - 4.0 K/uL   Monocytes Relative 7  3 - 12 %   Monocytes Absolute 1.4 (*) 0.1 - 1.0 K/uL   Eosinophils Relative 0  0 - 5 %   Eosinophils Absolute 0.0  0.0 - 0.7 K/uL   Basophils Relative 0  0 - 1 %   Basophils Absolute 0.0  0.0 - 0.1 K/uL    Ct Abdomen Pelvis W Contrast  07/19/2013   CLINICAL DATA:  Pain.  EXAM: CT ABDOMEN AND PELVIS WITH CONTRAST  TECHNIQUE: Multidetector CT imaging of the abdomen and pelvis was performed using the standard protocol following bolus administration of intravenous contrast.  CONTRAST:  175mL OMNIPAQUE IOHEXOL 300 MG/ML  SOLN  COMPARISON:  None.  FINDINGS: Liver normal. Spleen normal. Pancreas normal.No biliary distention. No gallbladder distention.  Adrenals normal. Left renal cyst. No focal renal abnormality otherwise noted. No hydronephrosis. No evidence of obstructing ureteral stone. Bladder non  distended. Uterus and adnexa unremarkable. No free pelvic fluid.  Aorta and visceral vessels widely patent.  Appendix is distended to 14 mm. Adjacent fat planes stranding and mesenteric lymph nodes are noted. These findings are consistent with appendicitis. No evidence of bowel distention or free air.  Heart size normal. Mild basilar atelectasis. No acute bony abnormality.  IMPRESSION: Severe changes of appendicitis. These results were called by telephone at the time of interpretation on 07/19/2013 at 2:24 PM to Dr. Harlan Stains , who verbally acknowledged these results.   Electronically Signed   By: Marcello Moores  Register   On: 07/19/2013 14:27    Review of Systems  Constitutional: Positive for chills. Negative for weight loss, malaise/fatigue and diaphoresis.  HENT: Negative.   Eyes: Negative.   Respiratory: Negative.   Cardiovascular: Negative.   Gastrointestinal: Positive for heartburn, nausea and abdominal pain. Negative for vomiting, diarrhea, constipation, blood in stool and melena.  Genitourinary: Negative.   Musculoskeletal: Negative.   Skin: Negative.   Neurological: Negative.  Negative for weakness.  Endo/Heme/Allergies: Negative.   Psychiatric/Behavioral: Negative.    Blood pressure 126/52, pulse 118, temperature 98.3 F (36.8 C), temperature source Oral, resp. rate 18, SpO2 100.00%. Physical Exam  Constitutional: She is oriented to person, place, and time. She appears well-developed and well-nourished. No distress.  HENT:  Head: Normocephalic and atraumatic.  Nose: Nose normal.  Eyes: Conjunctivae and EOM are normal. Pupils are equal, round, and reactive to light. Right eye exhibits no discharge. Left eye exhibits no discharge. No scleral icterus.  Neck: Normal range of motion. Neck supple. No JVD present. No tracheal deviation present. No thyromegaly present.  Cardiovascular: Normal rate, regular rhythm, normal heart sounds and intact distal pulses.  Exam reveals no gallop.   No  murmur heard. Respiratory: Effort normal and breath sounds normal. No respiratory distress. She has no wheezes. She has no rales. She exhibits no tenderness.  GI: Soft. Bowel sounds are normal. She exhibits no distension and no mass. There is tenderness. There is no rebound and no guarding.  Musculoskeletal: She exhibits no edema and no tenderness.  Lymphadenopathy:    She has no cervical adenopathy.  Neurological: She is alert and oriented to person, place, and time. No cranial nerve deficit.  Skin: Skin is warm and dry. No rash noted. She is not diaphoretic. No erythema. No pallor.  Psychiatric: She has a normal mood and affect. Her behavior is normal. Judgment and thought content normal.    Assessment/Plan:  1.  Acute appendicitis 2.  GERD 3.Hypertension 4.  Hypothyroid 5.  Migraines  Plan:  IV antibiotics, bowel rest and surgery this evening. Earnstine Regal 07/19/2013, 4:29 PM

## 2013-07-19 NOTE — ED Provider Notes (Signed)
CSN: [id number]     Arrival date & time 07/19/13  1507 History   First MD Initiated Contact with Patient 07/19/13 1520     Chief Complaint  Patient presents with  . Abdominal Pain     (Consider location/radiation/quality/duration/timing/severity/associated sxs/prior Treatment) Patient is a 68 y.o. female presenting with abdominal pain. The history is provided by the patient.  Abdominal Pain  patient here complaining of right lower quadrant pain began yesterday after she had dinner. She has had nausea no vomiting. No fever or chills. Saw her doctor and sent her for an outpatient CT which showed appendicitis. She was sent here to be seen by general surgeon.  Past Medical History  Diagnosis Date  . Hypertension    History reviewed. No pertinent past surgical history. No family history on file. History  Substance Use Topics  . Smoking status: Never Smoker   . Smokeless tobacco: Not on file  . Alcohol Use: No   OB History   Grav Para Term Preterm Abortions TAB SAB Ect Mult Living                 Review of Systems  Gastrointestinal: Positive for abdominal pain.  All other systems reviewed and are negative.     Allergies  Sulfa antibiotics  Home Medications   Prior to Admission medications   Medication Sig Start Date End Date Taking? Authorizing Provider  calcium carbonate (OS-CAL - DOSED IN MG OF ELEMENTAL CALCIUM) 1250 MG tablet Take 1 tablet by mouth daily with breakfast.   Yes Historical Provider, MD  cholecalciferol (VITAMIN D) 1000 UNITS tablet Take 1,000 Units by mouth daily.   Yes Historical Provider, MD  Cinnamon 500 MG capsule Take 500 mg by mouth daily.   Yes Historical Provider, MD  ranitidine (ZANTAC) 150 MG tablet Take 150 mg by mouth every morning.   Yes Historical Provider, MD  SUMAtriptan (IMITREX) 25 MG tablet Take 25 mg by mouth every 2 (two) hours as needed for migraine or headache. May repeat in 2 hours if headache persists or recurs.   Yes Historical  Provider, MD  triamterene-hydrochlorothiazide (MAXZIDE-25) 37.5-25 MG per tablet Take 1 tablet by mouth daily.   Yes Historical Provider, MD  vitamin B-12 (CYANOCOBALAMIN) 1000 MCG tablet Take 1,000 mcg by mouth daily.   Yes Historical Provider, MD   BP 126/52  Pulse 118  Temp(Src) 98.3 F (36.8 C) (Oral)  Resp 18  SpO2 100% Physical Exam  Nursing note and vitals reviewed. Constitutional: She is oriented to person, place, and time. She appears well-developed and well-nourished.  Non-toxic appearance. No distress.  HENT:  Head: Normocephalic and atraumatic.  Eyes: Conjunctivae, EOM and lids are normal. Pupils are equal, round, and reactive to light.  Neck: Normal range of motion. Neck supple. No tracheal deviation present. No mass present.  Cardiovascular: Normal rate, regular rhythm and normal heart sounds.  Exam reveals no gallop.   No murmur heard. Pulmonary/Chest: Effort normal and breath sounds normal. No stridor. No respiratory distress. She has no decreased breath sounds. She has no wheezes. She has no rhonchi. She has no rales.  Abdominal: Soft. Normal appearance and bowel sounds are normal. She exhibits no distension. There is tenderness in the right lower quadrant. There is guarding. There is no rebound and no CVA tenderness.    Musculoskeletal: Normal range of motion. She exhibits no edema and no tenderness.  Neurological: She is alert and oriented to person, place, and time. She has normal strength. No cranial  nerve deficit or sensory deficit. GCS eye subscore is 4. GCS verbal subscore is 5. GCS motor subscore is 6.  Skin: Skin is warm and dry. No abrasion and no rash noted.  Psychiatric: She has a normal mood and affect. Her speech is normal and behavior is normal.    ED Course  Procedures (including critical care time) Labs Review Labs Reviewed  CBC WITH DIFFERENTIAL  COMPREHENSIVE METABOLIC PANEL    Imaging Review Ct Abdomen Pelvis W Contrast  07/19/2013    CLINICAL DATA:  Pain.  EXAM: CT ABDOMEN AND PELVIS WITH CONTRAST  TECHNIQUE: Multidetector CT imaging of the abdomen and pelvis was performed using the standard protocol following bolus administration of intravenous contrast.  CONTRAST:  159mL OMNIPAQUE IOHEXOL 300 MG/ML  SOLN  COMPARISON:  None.  FINDINGS: Liver normal. Spleen normal. Pancreas normal.No biliary distention. No gallbladder distention.  Adrenals normal. Left renal cyst. No focal renal abnormality otherwise noted. No hydronephrosis. No evidence of obstructing ureteral stone. Bladder non distended. Uterus and adnexa unremarkable. No free pelvic fluid.  Aorta and visceral vessels widely patent.  Appendix is distended to 14 mm. Adjacent fat planes stranding and mesenteric lymph nodes are noted. These findings are consistent with appendicitis. No evidence of bowel distention or free air.  Heart size normal. Mild basilar atelectasis. No acute bony abnormality.  IMPRESSION: Severe changes of appendicitis. These results were called by telephone at the time of interpretation on 07/19/2013 at 2:24 PM to Dr. Harlan Stains , who verbally acknowledged these results.   Electronically Signed   By: Marcello Moores  Register   On: 07/19/2013 14:27     EKG Interpretation None      MDM   Final diagnoses:  None    Patient to be seen by general surgery and taken for operation    Leota Jacobsen, MD 07/19/13 1540

## 2013-07-19 NOTE — Consult Note (Signed)
General Surgery [facility]  Patient seen and examined in the ER.  Husband at bedside.  Discussed lap appendectomy and possibility of conversion to open procedure.  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  The risks and benefits of the procedure have been discussed at length with the patient.  The patient understands the proposed procedure, potential alternative treatments, and the course of recovery to be expected.  All of the patient's questions have been answered at this time.  The patient wishes to proceed with surgery.  Earnstine Regal, MD, [facility] Office: [phone]

## 2013-07-19 NOTE — ED Notes (Signed)
Per pt, RLQ pain yesterday after dinner-saw MD and has CT and it was a confirmed appendicitis

## 2013-07-20 ENCOUNTER — Encounter (HOSPITAL_COMMUNITY): Payer: Self-pay | Admitting: Surgery

## 2013-07-20 NOTE — Anesthesia Postprocedure Evaluation (Signed)
  Anesthesia Post-op Note  Patient: Julia Velez  Procedure(s) Performed: Procedure(s) (LRB): APPENDECTOMY LAPAROSCOPIC (N/A)  Patient Location: PACU  Anesthesia Type: General  Level of Consciousness: awake and alert   Airway and Oxygen Therapy: Patient Spontanous Breathing  Post-op Pain: mild  Post-op Assessment: Post-op Vital signs reviewed, Patient's Cardiovascular Status Stable, Respiratory Function Stable, Patent Airway and No signs of Nausea or vomiting  Last Vitals:  Filed Vitals:   07/20/13 0500  BP: 103/62  Pulse: 75  Temp: 36.7 C  Resp: 18    Post-op Vital Signs: stable   Complications: No apparent anesthesia complications

## 2013-07-20 NOTE — Progress Notes (Signed)
1 Day Post-Op  Subjective: She has only taken 1 vicodin so she is hurting now.  Up to BR, she doesn't really complain.  Sites look fine, she had some drainage on the umbilical one. Objective: Vital signs in last 24 hours: Temp:  [97.3 F (36.3 C)-98.7 F (37.1 C)] 98.1 F (36.7 C) (05/01 0500) Pulse Rate:  [75-118] 75 (05/01 0500) Resp:  [11-18] 18 (05/01 0500) BP: (103-154)/(52-82) 103/62 mmHg (05/01 0500) SpO2:  [92 %-100 %] 95 % (05/01 0500) Weight:  [65.772 kg (145 lb)] 65.772 kg (145 lb) (05/01 0628)  afebrile,, VSS  no labs Diet: regular  Intake/Output from previous day: 04/30 0701 - 05/01 0700 In: 1072.5 [I.V.:1072.5] Out: 1000 [Urine:1000] Intake/Output this shift: Total I/O In: 240 [P.O.:240] Out: -   General appearance: alert, cooperative and no distress Resp: clear to auscultation bilaterally GI: sore not really distended, site ok some drainage from main port. + BS  Lab Results:   Recent Labs  07/19/13 1550  WBC 19.5*  HGB 15.1*  HCT 43.7  PLT 274    BMET  Recent Labs  07/19/13 1550  NA 131*  K 4.3  CL 89*  CO2 25  GLUCOSE 125*  BUN 9  CREATININE 0.63  CALCIUM 9.7   PT/INR No results found for this basename: LABPROT, INR,  in the last 72 hours   Recent Labs Lab 07/19/13 1550  AST 86*  ALT 74*  ALKPHOS 104  BILITOT 2.1*  PROT 7.7  ALBUMIN 3.9     Lipase  No results found for this basename: lipase     Studies/Results: Ct Abdomen Pelvis W Contrast  07/19/2013   CLINICAL DATA:  Pain.  EXAM: CT ABDOMEN AND PELVIS WITH CONTRAST  TECHNIQUE: Multidetector CT imaging of the abdomen and pelvis was performed using the standard protocol following bolus administration of intravenous contrast.  CONTRAST:  157mL OMNIPAQUE IOHEXOL 300 MG/ML  SOLN  COMPARISON:  None.  FINDINGS: Liver normal. Spleen normal. Pancreas normal.No biliary distention. No gallbladder distention.  Adrenals normal. Left renal cyst. No focal renal abnormality otherwise  noted. No hydronephrosis. No evidence of obstructing ureteral stone. Bladder non distended. Uterus and adnexa unremarkable. No free pelvic fluid.  Aorta and visceral vessels widely patent.  Appendix is distended to 14 mm. Adjacent fat planes stranding and mesenteric lymph nodes are noted. These findings are consistent with appendicitis. No evidence of bowel distention or free air.  Heart size normal. Mild basilar atelectasis. No acute bony abnormality.  IMPRESSION: Severe changes of appendicitis. These results were called by telephone at the time of interpretation on 07/19/2013 at 2:24 PM to Dr. Harlan Stains , who verbally acknowledged these results.   Electronically Signed   By: Marcello Moores  Register   On: 07/19/2013 14:27    Medications: . ampicillin-sulbactam (UNASYN) IV  3 g Intravenous Q6H  . enoxaparin (LOVENOX) injection  40 mg Subcutaneous Q24H    Assessment/Plan 1. Acute appendicitis with perforation and peritonitis  S/p Laparoscopic appendectomy 07/19/13, Dr. Harlow Asa 2. GERD  3.Hypertension  4. Hypothyroid  5. Migraines   Plan:  Continue antibiotics and see how she does, home possibly tomorrow on antibiotics.  Her LFT's were also up some so I will recheck all her labs in Am.   LOS: 1 day    Earnstine Regal 07/20/2013

## 2013-07-20 NOTE — Progress Notes (Signed)
General Surgery [facility]  Patient seen and examined.  Doing well.  Ordering dinner.  Mild pain.  Continue IV abx.  Anticipate discharge home in AM on oral abx for one week.  Earnstine Regal, MD, [facility] Office: [phone]

## 2013-07-21 LAB — COMPREHENSIVE METABOLIC PANEL
ALT: 67 U/L — ABNORMAL HIGH (ref 0–35)
AST: 56 U/L — AB (ref 0–37)
Albumin: 2.8 g/dL — ABNORMAL LOW (ref 3.5–5.2)
Alkaline Phosphatase: 83 U/L (ref 39–117)
BUN: 15 mg/dL (ref 6–23)
CALCIUM: 8.7 mg/dL (ref 8.4–10.5)
CO2: 26 mEq/L (ref 19–32)
Chloride: 94 mEq/L — ABNORMAL LOW (ref 96–112)
Creatinine, Ser: 0.79 mg/dL (ref 0.50–1.10)
GFR calc Af Amer: >90 mL/min (ref >90)
GFR calc non Af Amer: 85 mL/min — ABNORMAL LOW (ref >90)
Glucose, Bld: 102 mg/dL — ABNORMAL HIGH (ref 70–99)
Potassium: 3.6 mEq/L — ABNORMAL LOW (ref 3.7–5.3)
Sodium: 131 mEq/L — ABNORMAL LOW (ref 137–147)
Total Bilirubin: 0.7 mg/dL (ref 0.3–1.2)
Total Protein: 5.9 g/dL — ABNORMAL LOW (ref 6.0–8.3)

## 2013-07-21 LAB — CBC
HCT: 34.8 % — ABNORMAL LOW (ref 36.0–46.0)
Hemoglobin: 11.5 g/dL — ABNORMAL LOW (ref 12.0–15.0)
MCH: 31.6 pg (ref 26.0–34.0)
MCHC: 33 g/dL (ref 30.0–36.0)
MCV: 95.6 fL (ref 78.0–100.0)
PLATELETS: 248 10*3/uL (ref 150–400)
RBC: 3.64 MIL/uL — ABNORMAL LOW (ref 3.87–5.11)
RDW: 13 % (ref 11.5–15.5)
WBC: 13.8 10*3/uL — ABNORMAL HIGH (ref 4.0–10.5)

## 2013-07-21 MED ORDER — AMOXICILLIN-POT CLAVULANATE 875-125 MG PO TABS: 1.0000 | ORAL_TABLET | Freq: Two times a day (BID) | ORAL | Status: DC

## 2013-07-21 MED ORDER — HYDROCODONE-ACETAMINOPHEN 5-325 MG PO TABS: 1.0000 | ORAL_TABLET | ORAL | Status: DC | PRN

## 2013-07-21 NOTE — Discharge Instructions (Signed)
[facility] ______[facility] LAPAROSCOPIC SURGERY: POST OP INSTRUCTIONS Always review your discharge instruction sheet given to you by the facility where your surgery was performed. IF YOU HAVE DISABILITY OR FAMILY LEAVE FORMS, YOU MUST BRING THEM TO THE OFFICE FOR PROCESSING.   DO NOT GIVE THEM TO YOUR DOCTOR.  1. A prescription for pain medication may be given to you upon discharge.  Take your pain medication as prescribed, if needed.  If narcotic pain medicine is not needed, then you may take acetaminophen (Tylenol) or ibuprofen (Advil) as needed. 2. Take your usually prescribed medications unless otherwise directed. 3. If you need a refill on your pain medication, please contact your pharmacy.  They will contact our office to request authorization. Prescriptions will not be filled after 5pm or on week-ends. 4. You should follow a light diet the first few days after arrival home, such as soup and crackers, etc.  Be sure to include lots of fluids daily. 5. Most patients will experience some swelling and bruising in the area of the incisions.  Ice packs will help.  Swelling and bruising can take several days to resolve.  6. It is common to experience some constipation if taking pain medication after surgery.  Increasing fluid intake and taking a stool softener (such as Colace) will usually help or prevent this problem from occurring.  A mild laxative (Milk of Magnesia or Miralax) should be taken according to package instructions if there are no bowel movements after 48 hours. 7. Unless discharge instructions indicate otherwise, you may remove your bandages 24-48 hours after surgery, and you may shower at that time.  You may have steri-strips (small skin tapes) in place directly over the incision.  These strips should be left on the skin.  If your surgeon used skin glue on the incision, you may shower in 24 hours.  The glue will flake off over the next 2-3 weeks.  Any sutures or staples will  be removed at the office during your follow-up visit. 8. ACTIVITIES:  You may resume regular (light) daily activities beginning the next day--such as daily self-care, walking, climbing stairs--gradually increasing activities as tolerated.  You may have sexual intercourse when it is comfortable.  Refrain from any heavy lifting or straining-nothing over 10 pounds for 2 weeks.  a. You may drive when you are no longer taking prescription pain medication, you can comfortably wear a seatbelt, and you can safely maneuver your car and apply brakes. b. RETURN TO WORK:  __________________________________________________________ 9. You should see your doctor in the office for a follow-up appointment approximately 2-3 weeks after your surgery.  Make sure that you call for this appointment within a day or two after you arrive home to insure a convenient appointment time. 10. OTHER INSTRUCTIONS: __________________________________________________________________________________________________________________________ __________________________________________________________________________________________________________________________ WHEN TO CALL YOUR DOCTOR: 1. Fever over 101.0 2. Inability to urinate 3. Continued bleeding from incision. 4. Increased pain, redness, or drainage from the incision. 5. Increasing abdominal pain  The clinic staff is available to answer your questions during regular business hours.  Please dont hesitate to call and ask to speak to one of the nurses for clinical concerns.  If you have a medical emergency, go to the nearest emergency room or call 911.  A surgeon from [facility] is always on call at the hospital. [address], [place], [place], [place]  [address] ? [address], [place], [place]   [address] [phone] ? [phone] ? FAX [phone] Web site: [email or web address]

## 2013-07-21 NOTE — Progress Notes (Signed)
2 Days Post-Op  Subjective: Tolerating diet.  Passing some gas.  Wants to go home.  Objective: Vital signs in last 24 hours: Temp:  [97.6 F (36.4 C)-97.9 F (36.6 C)] 97.9 F (36.6 C) (05/02 0618) Pulse Rate:  [77-90] 77 (05/02 0618) Resp:  [18] 18 (05/02 0618) BP: (115-127)/(72-81) 127/81 mmHg (05/02 0618) SpO2:  [94 %-96 %] 94 % (05/02 0618) Last BM Date: 07/19/13  Intake/Output from previous day: 05/01 0701 - 05/02 0700 In: 960 [P.O.:960] Out: 2000 [Urine:2000] Intake/Output this shift:    PE: General- In NAD Abdomen-soft, dried blood on subumbilical wound  Lab Results:   Recent Labs  07/19/13 1550 07/21/13 0426  WBC 19.5* 13.8*  HGB 15.1* 11.5*  HCT 43.7 34.8*  PLT 274 248   BMET  Recent Labs  07/19/13 1550 07/21/13 0426  NA 131* 131*  K 4.3 3.6*  CL 89* 94*  CO2 25 26  GLUCOSE 125* 102*  BUN 9 15  CREATININE 0.63 0.79  CALCIUM 9.7 8.7   PT/INR No results found for this basename: LABPROT, INR,  in the last 72 hours Comprehensive Metabolic Panel:    Component Value Date/Time   NA 131* 07/21/2013 0426   NA 131* 07/19/2013 1550   K 3.6* 07/21/2013 0426   K 4.3 07/19/2013 1550   CL 94* 07/21/2013 0426   CL 89* 07/19/2013 1550   CO2 26 07/21/2013 0426   CO2 25 07/19/2013 1550   BUN 15 07/21/2013 0426   BUN 9 07/19/2013 1550   CREATININE 0.79 07/21/2013 0426   CREATININE 0.63 07/19/2013 1550   GLUCOSE 102* 07/21/2013 0426   GLUCOSE 125* 07/19/2013 1550   CALCIUM 8.7 07/21/2013 0426   CALCIUM 9.7 07/19/2013 1550   AST 56* 07/21/2013 0426   AST 86* 07/19/2013 1550   ALT 67* 07/21/2013 0426   ALT 74* 07/19/2013 1550   ALKPHOS 83 07/21/2013 0426   ALKPHOS 104 07/19/2013 1550   BILITOT 0.7 07/21/2013 0426   BILITOT 2.1* 07/19/2013 1550   PROT 5.9* 07/21/2013 0426   PROT 7.7 07/19/2013 1550   ALBUMIN 2.8* 07/21/2013 0426   ALBUMIN 3.9 07/19/2013 1550     Studies/Results: Ct Abdomen Pelvis W Contrast  07/19/2013   CLINICAL DATA:  Pain.  EXAM: CT ABDOMEN AND PELVIS WITH  CONTRAST  TECHNIQUE: Multidetector CT imaging of the abdomen and pelvis was performed using the standard protocol following bolus administration of intravenous contrast.  CONTRAST:  142mL OMNIPAQUE IOHEXOL 300 MG/ML  SOLN  COMPARISON:  None.  FINDINGS: Liver normal. Spleen normal. Pancreas normal.No biliary distention. No gallbladder distention.  Adrenals normal. Left renal cyst. No focal renal abnormality otherwise noted. No hydronephrosis. No evidence of obstructing ureteral stone. Bladder non distended. Uterus and adnexa unremarkable. No free pelvic fluid.  Aorta and visceral vessels widely patent.  Appendix is distended to 14 mm. Adjacent fat planes stranding and mesenteric lymph nodes are noted. These findings are consistent with appendicitis. No evidence of bowel distention or free air.  Heart size normal. Mild basilar atelectasis. No acute bony abnormality.  IMPRESSION: Severe changes of appendicitis. These results were called by telephone at the time of interpretation on 07/19/2013 at 2:24 PM to Dr. Harlan Stains , who verbally acknowledged these results.   Electronically Signed   By: Marcello Moores  Register   On: 07/19/2013 14:27    Anti-infectives: Anti-infectives   Start     Dose/Rate Route Frequency Ordered Stop   07/19/13 2300  Ampicillin-Sulbactam (UNASYN) 3 g in sodium chloride  0.9 % 100 mL IVPB     3 g 100 mL/hr over 60 Minutes Intravenous Every 6 hours 07/19/13 1954     07/19/13 1630  Ampicillin-Sulbactam (UNASYN) 3 g in sodium chloride 0.9 % 100 mL IVPB     3 g 100 mL/hr over 60 Minutes Intravenous NOW 07/19/13 1625 07/19/13 1704      Assessment Principal Problem:  Perforated acute appendicitis s/p laparoscopic appendectomy 07/19/13-improving   LOS: 2 days   Plan: Discharge on oral abxs.  Discharge instructions given to her.   Julia Velez 07/21/2013

## 2013-07-22 NOTE — Discharge Summary (Signed)
Physician Discharge Summary  Patient ID: Julia Velez MRN: [id number] DOB/AGE: 1946/01/19 68 y.o.  Admit date: 07/19/2013 Discharge date: 07/21/2013  Admission Diagnoses:  Acute perforated appendicitis  Discharge Diagnoses:  Same    Discharged Condition: good  Hospital Course: She was admitted and underwent a laparoscopic appendectomy. She was started on intravenous antibiotics. She was started on a liquid diet and this was able to be advanced.  On her second postoperative day, she was afebrile, passing gas, tolerating her diet, and she wanted to go home.  She was discharged on oral antibiotics. Discharge instructions were given to her.  Consults: None  Significant Diagnostic Studies: none  Treatments: surgery: Laparoscopic appendectomy  Discharge Exam: Blood pressure 127/81, pulse 77, temperature 97.9 F (36.6 C), temperature source Oral, resp. rate 18, height 5' 0.5" (1.537 m), weight 145 lb (65.772 kg), SpO2 94.00%.   Disposition: 01-Home or Self Care     Medication List         amoxicillin-clavulanate 875-125 MG per tablet  Commonly known as:  AUGMENTIN  Take 1 tablet by mouth 2 (two) times daily.     calcium carbonate 1250 MG tablet  Commonly known as:  OS-CAL - dosed in mg of elemental calcium  Take 1 tablet by mouth daily with breakfast.     cholecalciferol 1000 UNITS tablet  Commonly known as:  VITAMIN D  Take 1,000 Units by mouth daily.     Cinnamon 500 MG capsule  Take 500 mg by mouth daily.     HYDROcodone-acetaminophen 5-325 MG per tablet  Commonly known as:  NORCO/VICODIN  Take 1-2 tablets by mouth every 4 (four) hours as needed for moderate pain.     ranitidine 150 MG tablet  Commonly known as:  ZANTAC  Take 150 mg by mouth every morning.     SUMAtriptan 25 MG tablet  Commonly known as:  IMITREX  Take 25 mg by mouth every 2 (two) hours as needed for migraine or headache. May repeat in 2 hours if headache persists or recurs.     triamterene-hydrochlorothiazide 37.5-25 MG per tablet  Commonly known as:  MAXZIDE-25  Take 1 tablet by mouth daily.     vitamin B-12 1000 MCG tablet  Commonly known as:  CYANOCOBALAMIN  Take 1,000 mcg by mouth daily.         Signed: Rhunette Croft Ronell Duffus 07/22/2013, 10:30 AM

## 2013-07-23 DIAGNOSIS — E0500 Thyrotoxicosis with diffuse goiter without thyrotoxic crisis or storm: Secondary | ICD-10-CM | POA: Diagnosis not present

## 2013-07-25 DIAGNOSIS — E0590 Thyrotoxicosis, unspecified without thyrotoxic crisis or storm: Secondary | ICD-10-CM | POA: Diagnosis not present

## 2013-07-25 DIAGNOSIS — E0500 Thyrotoxicosis with diffuse goiter without thyrotoxic crisis or storm: Secondary | ICD-10-CM | POA: Diagnosis not present

## 2013-07-25 DIAGNOSIS — E042 Nontoxic multinodular goiter: Secondary | ICD-10-CM | POA: Diagnosis not present

## 2013-07-29 ENCOUNTER — Encounter (HOSPITAL_COMMUNITY): Payer: Self-pay | Admitting: Emergency Medicine

## 2013-07-29 ENCOUNTER — Inpatient Hospital Stay (HOSPITAL_COMMUNITY)
Admission: EM | Admit: 2013-07-29 | Discharge: 2013-08-02 | DRG: 862 | Disposition: A | Discharge status: Home / Self Care | Payer: Medicare Other | Attending: General Surgery | Admitting: General Surgery

## 2013-07-29 ENCOUNTER — Emergency Department (HOSPITAL_COMMUNITY): Payer: Medicare Other

## 2013-07-29 DIAGNOSIS — K659 Peritonitis, unspecified: Secondary | ICD-10-CM | POA: Diagnosis not present

## 2013-07-29 DIAGNOSIS — K219 Gastro-esophageal reflux disease without esophagitis: Secondary | ICD-10-CM | POA: Diagnosis present

## 2013-07-29 DIAGNOSIS — Y836 Removal of other organ (partial) (total) as the cause of abnormal reaction of the patient, or of later complication, without mention of misadventure at the time of the procedure: Secondary | ICD-10-CM | POA: Diagnosis present

## 2013-07-29 DIAGNOSIS — R1031 Right lower quadrant pain: Secondary | ICD-10-CM | POA: Diagnosis present

## 2013-07-29 DIAGNOSIS — R109 Unspecified abdominal pain: Secondary | ICD-10-CM | POA: Diagnosis present

## 2013-07-29 DIAGNOSIS — L02219 Cutaneous abscess of trunk, unspecified: Secondary | ICD-10-CM | POA: Diagnosis not present

## 2013-07-29 DIAGNOSIS — G8918 Other acute postprocedural pain: Secondary | ICD-10-CM | POA: Diagnosis present

## 2013-07-29 DIAGNOSIS — K651 Peritoneal abscess: Secondary | ICD-10-CM | POA: Diagnosis present

## 2013-07-29 DIAGNOSIS — Z881 Allergy status to other antibiotic agents status: Secondary | ICD-10-CM | POA: Diagnosis not present

## 2013-07-29 DIAGNOSIS — A419 Sepsis, unspecified organism: Secondary | ICD-10-CM | POA: Diagnosis not present

## 2013-07-29 DIAGNOSIS — E0590 Thyrotoxicosis, unspecified without thyrotoxic crisis or storm: Secondary | ICD-10-CM | POA: Diagnosis present

## 2013-07-29 DIAGNOSIS — G43909 Migraine, unspecified, not intractable, without status migrainosus: Secondary | ICD-10-CM | POA: Diagnosis present

## 2013-07-29 DIAGNOSIS — I10 Essential (primary) hypertension: Secondary | ICD-10-CM | POA: Diagnosis present

## 2013-07-29 DIAGNOSIS — T8140XA Infection following a procedure, unspecified, initial encounter: Secondary | ICD-10-CM

## 2013-07-29 DIAGNOSIS — E039 Hypothyroidism, unspecified: Secondary | ICD-10-CM | POA: Diagnosis present

## 2013-07-29 DIAGNOSIS — R918 Other nonspecific abnormal finding of lung field: Secondary | ICD-10-CM | POA: Diagnosis not present

## 2013-07-29 DIAGNOSIS — L03319 Cellulitis of trunk, unspecified: Secondary | ICD-10-CM | POA: Diagnosis not present

## 2013-07-29 LAB — CBC WITH DIFFERENTIAL/PLATELET
Basophils Absolute: 0 10*3/uL (ref 0.0–0.1)
Basophils Relative: 0 % (ref 0–1)
EOS ABS: 0.4 10*3/uL (ref 0.0–0.7)
EOS PCT: 2 % (ref 0–5)
HCT: 42 % (ref 36.0–46.0)
HEMOGLOBIN: 14.3 g/dL (ref 12.0–15.0)
Lymphocytes Relative: 8 % — ABNORMAL LOW (ref 12–46)
Lymphs Abs: 1.7 10*3/uL (ref 0.7–4.0)
MCH: 32.1 pg (ref 26.0–34.0)
MCHC: 34 g/dL (ref 30.0–36.0)
MCV: 94.2 fL (ref 78.0–100.0)
MONOS PCT: 3 % (ref 3–12)
Monocytes Absolute: 0.6 10*3/uL (ref 0.1–1.0)
NEUTROS PCT: 88 % — AB (ref 43–77)
Neutro Abs: 19.2 10*3/uL — ABNORMAL HIGH (ref 1.7–7.7)
Platelets: 393 10*3/uL (ref 150–400)
RBC: 4.46 MIL/uL (ref 3.87–5.11)
RDW: 12.8 % (ref 11.5–15.5)
WBC: 21.8 10*3/uL — AB (ref 4.0–10.5)

## 2013-07-29 LAB — COMPREHENSIVE METABOLIC PANEL
ALK PHOS: 146 U/L — AB (ref 39–117)
ALT: 40 U/L — AB (ref 0–35)
AST: 22 U/L (ref 0–37)
Albumin: 3.9 g/dL (ref 3.5–5.2)
BUN: 13 mg/dL (ref 6–23)
CO2: 25 mEq/L (ref 19–32)
Calcium: 9.2 mg/dL (ref 8.4–10.5)
Chloride: 92 mEq/L — ABNORMAL LOW (ref 96–112)
Creatinine, Ser: 0.65 mg/dL (ref 0.50–1.10)
GLUCOSE: 140 mg/dL — AB (ref 70–99)
POTASSIUM: 3.7 meq/L (ref 3.7–5.3)
Sodium: 133 mEq/L — ABNORMAL LOW (ref 137–147)
TOTAL PROTEIN: 7.5 g/dL (ref 6.0–8.3)
Total Bilirubin: 0.5 mg/dL (ref 0.3–1.2)

## 2013-07-29 LAB — LIPASE, BLOOD: Lipase: 25 U/L (ref 11–59)

## 2013-07-29 LAB — I-STAT TROPONIN, ED: Troponin i, poc: 0 ng/mL (ref 0.00–0.08)

## 2013-07-29 MED ORDER — SODIUM CHLORIDE 0.9 % IV SOLN
Freq: Once | INTRAVENOUS | Status: AC
Start: 2013-07-29 — End: 2013-07-30
  Administered 2013-07-30: 01:00:00 via INTRAVENOUS

## 2013-07-29 MED ORDER — HYDROMORPHONE HCL PF 1 MG/ML IJ SOLN
1.0000 mg | Freq: Once | INTRAMUSCULAR | Status: AC
  Administered 2013-07-30: 1 mg via INTRAVENOUS
  Filled 2013-07-29: qty 1

## 2013-07-29 MED ORDER — ONDANSETRON HCL 4 MG/2ML IJ SOLN
4.0000 mg | Freq: Once | INTRAMUSCULAR | Status: AC
  Administered 2013-07-30: 4 mg via INTRAVENOUS
  Filled 2013-07-29: qty 2

## 2013-07-29 MED ORDER — SODIUM CHLORIDE 0.9 % IV BOLUS (SEPSIS)
1000.0000 mL | Freq: Once | INTRAVENOUS | Status: AC
  Administered 2013-07-30: 1000 mL via INTRAVENOUS

## 2013-07-29 NOTE — ED Notes (Signed)
Pt states she had an appendectomy 1 week ago and has felt fine until today when she went to take a walk and was in excruciating pain after. Pain is mostly upper abdomen radiating to back. Alert and oriented. Nauseated.

## 2013-07-29 NOTE — ED Provider Notes (Addendum)
CSN: [id number]     Arrival date & time 07/29/13  2256 History   First MD Initiated Contact with Patient 07/29/13 2310     Chief Complaint  Patient presents with  . Abdominal Pain     (Consider location/radiation/quality/duration/timing/severity/associated sxs/prior Treatment) HPI Comments: Pt comes in with cc of abd pain. Pt has hx of HTN, recent appendectomy. States that she started having pain this evening around 7 pm. The pain is hard to localize, but she feels most of her pain is in the back, and sometimes she feels it in the front as well. Pt has no hx of pain like this. + nausea, no emesis. Pt has no hx of renal stones, no uti like sx. She has no hx of liver problems, cholelithiasis, pelvic disorders or GERD/stomach ulcers. No numbness, weakness.  Patient is a 68 y.o. female presenting with abdominal pain. The history is provided by the patient.  Abdominal Pain Associated symptoms: nausea   Associated symptoms: no chest pain, no dysuria, no shortness of breath and no vomiting     Past Medical History  Diagnosis Date  . Hypertension   . Hyperthyroidism    Past Surgical History  Procedure Laterality Date  . No past surgeries    . Laparoscopic appendectomy N/A 07/19/2013    Procedure: APPENDECTOMY LAPAROSCOPIC;  Surgeon: Earnstine Regal, MD;  Location: [facility];  Service: General;  Laterality: N/A;   History reviewed. No pertinent family history. History  Substance Use Topics  . Smoking status: Never Smoker   . Smokeless tobacco: Never Used  . Alcohol Use: Yes     Comment: "mixed drink or wine sometimes"   OB History   Grav Para Term Preterm Abortions TAB SAB Ect Mult Living                 Review of Systems  Constitutional: Positive for activity change.  Respiratory: Negative for shortness of breath.   Cardiovascular: Negative for chest pain.  Gastrointestinal: Positive for nausea and abdominal pain. Negative for vomiting.  Genitourinary: Negative for dysuria.   Musculoskeletal: Negative for neck pain.  Neurological: Negative for headaches.  All other systems reviewed and are negative.     Allergies  Other and Sulfa antibiotics  Home Medications   Prior to Admission medications   Medication Sig Start Date End Date Taking? Authorizing Provider  amoxicillin-clavulanate (AUGMENTIN) 875-125 MG per tablet Take 1 tablet by mouth 2 (two) times daily. 07/21/13   Odis Hollingshead, MD  calcium carbonate (OS-CAL - DOSED IN MG OF ELEMENTAL CALCIUM) 1250 MG tablet Take 1 tablet by mouth daily with breakfast.    Historical Provider, MD  cholecalciferol (VITAMIN D) 1000 UNITS tablet Take 1,000 Units by mouth daily.    Historical Provider, MD  Cinnamon 500 MG capsule Take 500 mg by mouth daily.    Historical Provider, MD  HYDROcodone-acetaminophen (NORCO/VICODIN) 5-325 MG per tablet Take 1-2 tablets by mouth every 4 (four) hours as needed for moderate pain. 07/21/13   Odis Hollingshead, MD  ranitidine (ZANTAC) 150 MG tablet Take 150 mg by mouth every morning.    Historical Provider, MD  SUMAtriptan (IMITREX) 25 MG tablet Take 25 mg by mouth every 2 (two) hours as needed for migraine or headache. May repeat in 2 hours if headache persists or recurs.    Historical Provider, MD  triamterene-hydrochlorothiazide (MAXZIDE-25) 37.5-25 MG per tablet Take 1 tablet by mouth daily.    Historical Provider, MD  vitamin B-12 (CYANOCOBALAMIN) 1000 MCG  tablet Take 1,000 mcg by mouth daily.    Historical Provider, MD   BP 130/84  Pulse 84  Temp(Src) 97.5 F (36.4 C) (Oral)  Resp 18  SpO2 100% Physical Exam  Nursing note and vitals reviewed. Constitutional: She is oriented to person, place, and time. She appears well-developed and well-nourished.  HENT:  Head: Normocephalic and atraumatic.  Eyes: EOM are normal. Pupils are equal, round, and reactive to light.  Neck: Neck supple.  Cardiovascular: Normal rate, regular rhythm, normal heart sounds and intact distal pulses.    No murmur heard. Pulmonary/Chest: Effort normal. No respiratory distress.  Abdominal: Soft. She exhibits no distension. There is no tenderness. There is no rebound and no guarding.  Neurological: She is alert and oriented to person, place, and time.  Skin: Skin is warm and dry.    ED Course  Procedures (including critical care time) Labs Review Labs Reviewed  CBC WITH DIFFERENTIAL  COMPREHENSIVE METABOLIC PANEL  LIPASE, BLOOD  URINALYSIS, ROUTINE W REFLEX MICROSCOPIC  I-STAT TROPOININ, ED    Imaging Review No results found.   EKG Interpretation None      MDM   Final diagnoses:  None    Pt comes in with sudden onset abd pain, and back pain with nausea. On exam - we couldn't localize any abd tenderness.  My current ddx is renal stones, pancreatitis, pyelo, PUD and with the recent surgery - abscess. No risk factors for dissection.  We will get Xray to r/o free air.   I believe CT abd with contrast would be the best test - to look in to pancreatitis, hepatobiliary dz, abscess and also get some pelvic morphology and renal stones morphology.   Varney Biles, MD 07/30/13 0006  1:33 AM CT shows phlegmon in the RLQ. Will call Surgery, and start antibiotics after consulting with them.  Varney Biles, MD 07/30/13 [phone]

## 2013-07-29 NOTE — ED Notes (Signed)
Patient c/o of pain that in band like around the abdomen.

## 2013-07-29 NOTE — ED Notes (Signed)
EKG given to EDP,Nanavati,MD. For review. 

## 2013-07-30 ENCOUNTER — Encounter (HOSPITAL_COMMUNITY): Payer: Self-pay

## 2013-07-30 ENCOUNTER — Emergency Department (HOSPITAL_COMMUNITY): Payer: Medicare Other

## 2013-07-30 DIAGNOSIS — G8918 Other acute postprocedural pain: Secondary | ICD-10-CM | POA: Diagnosis present

## 2013-07-30 DIAGNOSIS — R109 Unspecified abdominal pain: Secondary | ICD-10-CM

## 2013-07-30 LAB — URINALYSIS, ROUTINE W REFLEX MICROSCOPIC
BILIRUBIN URINE: NEGATIVE
GLUCOSE, UA: NEGATIVE mg/dL
HGB URINE DIPSTICK: NEGATIVE
KETONES UR: NEGATIVE mg/dL
Leukocytes, UA: NEGATIVE
Nitrite: NEGATIVE
PH: 6 (ref 5.0–8.0)
PROTEIN: NEGATIVE mg/dL
Specific Gravity, Urine: 1.039 — ABNORMAL HIGH (ref 1.005–1.030)
Urobilinogen, UA: 0.2 mg/dL (ref 0.0–1.0)

## 2013-07-30 LAB — BASIC METABOLIC PANEL
BUN: 9 mg/dL (ref 6–23)
CALCIUM: 7.4 mg/dL — AB (ref 8.4–10.5)
CO2: 22 mEq/L (ref 19–32)
CREATININE: 0.64 mg/dL (ref 0.50–1.10)
Chloride: 98 mEq/L (ref 96–112)
Glucose, Bld: 117 mg/dL — ABNORMAL HIGH (ref 70–99)
Potassium: 3.6 mEq/L — ABNORMAL LOW (ref 3.7–5.3)
SODIUM: 133 meq/L — AB (ref 137–147)

## 2013-07-30 LAB — CBC
HCT: 35.5 % — ABNORMAL LOW (ref 36.0–46.0)
Hemoglobin: 12 g/dL (ref 12.0–15.0)
MCH: 31.8 pg (ref 26.0–34.0)
MCHC: 33.8 g/dL (ref 30.0–36.0)
MCV: 94.2 fL (ref 78.0–100.0)
Platelets: 347 10*3/uL (ref 150–400)
RBC: 3.77 MIL/uL — ABNORMAL LOW (ref 3.87–5.11)
RDW: 12.9 % (ref 11.5–15.5)
WBC: 20.4 10*3/uL — ABNORMAL HIGH (ref 4.0–10.5)

## 2013-07-30 MED ORDER — LACTATED RINGERS IV BOLUS (SEPSIS): 1000.0000 mL | Freq: Once | INTRAVENOUS | Status: DC

## 2013-07-30 MED ORDER — PROMETHAZINE HCL 25 MG/ML IJ SOLN
12.5000 mg | Freq: Once | INTRAMUSCULAR | Status: AC
  Administered 2013-07-30: 12.5 mg via INTRAVENOUS
  Filled 2013-07-30: qty 1

## 2013-07-30 MED ORDER — TRIAMTERENE-HCTZ 37.5-25 MG PO TABS
1.0000 | ORAL_TABLET | Freq: Every day | ORAL | Status: DC
  Administered 2013-07-30 – 2013-08-01 (×3): 1 via ORAL
  Filled 2013-07-30 (×4): qty 1

## 2013-07-30 MED ORDER — HEPARIN SODIUM (PORCINE) 5000 UNIT/ML IJ SOLN
5000.0000 [IU] | Freq: Three times a day (TID) | INTRAMUSCULAR | Status: DC
  Administered 2013-07-30 – 2013-08-01 (×7): 5000 [IU] via SUBCUTANEOUS
  Filled 2013-07-30 (×13): qty 1

## 2013-07-30 MED ORDER — CHLORHEXIDINE GLUCONATE 0.12 % MT SOLN
15.0000 mL | Freq: Two times a day (BID) | OROMUCOSAL | Status: DC
  Administered 2013-07-30 – 2013-08-01 (×5): 15 mL via OROMUCOSAL
  Filled 2013-07-30 (×8): qty 15

## 2013-07-30 MED ORDER — METRONIDAZOLE IN NACL 5-0.79 MG/ML-% IV SOLN
500.0000 mg | Freq: Three times a day (TID) | INTRAVENOUS | Status: DC
  Administered 2013-07-30 – 2013-08-02 (×10): 500 mg via INTRAVENOUS
  Filled 2013-07-30 (×11): qty 100

## 2013-07-30 MED ORDER — ONDANSETRON HCL 4 MG/2ML IJ SOLN
4.0000 mg | Freq: Four times a day (QID) | INTRAMUSCULAR | Status: DC | PRN
  Administered 2013-07-30 – 2013-08-01 (×2): 4 mg via INTRAVENOUS
  Filled 2013-07-30 (×2): qty 2

## 2013-07-30 MED ORDER — HYDROMORPHONE HCL PF 1 MG/ML IJ SOLN: 1.0000 mg | INTRAMUSCULAR | Status: DC | PRN

## 2013-07-30 MED ORDER — PANTOPRAZOLE SODIUM 40 MG IV SOLR
40.0000 mg | Freq: Every day | INTRAVENOUS | Status: DC
  Administered 2013-07-30: 40 mg via INTRAVENOUS
  Filled 2013-07-30 (×2): qty 40

## 2013-07-30 MED ORDER — SUMATRIPTAN SUCCINATE 25 MG PO TABS
25.0000 mg | ORAL_TABLET | ORAL | Status: DC | PRN
Start: 2013-07-30 — End: 2013-08-02
  Administered 2013-07-30 – 2013-08-01 (×2): 25 mg via ORAL
  Filled 2013-07-30 (×3): qty 1

## 2013-07-30 MED ORDER — OXYCODONE HCL 5 MG PO TABS
5.0000 mg | ORAL_TABLET | ORAL | Status: DC | PRN
  Administered 2013-08-01 (×2): 5 mg via ORAL
  Filled 2013-07-30 (×3): qty 1

## 2013-07-30 MED ORDER — PIPERACILLIN-TAZOBACTAM 4.5 G IVPB: 4.5000 g | Freq: Once | INTRAVENOUS | Status: DC

## 2013-07-30 MED ORDER — IOHEXOL 300 MG/ML  SOLN
100.0000 mL | Freq: Once | INTRAMUSCULAR | Status: AC | PRN
  Administered 2013-07-30: 100 mL via INTRAVENOUS

## 2013-07-30 MED ORDER — IOHEXOL 300 MG/ML  SOLN
50.0000 mL | Freq: Once | INTRAMUSCULAR | Status: AC | PRN
  Administered 2013-07-30: 50 mL via ORAL

## 2013-07-30 MED ORDER — KCL IN DEXTROSE-NACL 20-5-0.9 MEQ/L-%-% IV SOLN
INTRAVENOUS | Status: DC
  Administered 2013-07-30 – 2013-07-31 (×3): via INTRAVENOUS
  Filled 2013-07-30 (×4): qty 1000

## 2013-07-30 MED ORDER — HYDROMORPHONE HCL PF 1 MG/ML IJ SOLN
1.0000 mg | INTRAMUSCULAR | Status: DC | PRN
  Administered 2013-07-30: 1 mg via INTRAVENOUS
  Filled 2013-07-30: qty 1

## 2013-07-30 MED ORDER — SODIUM CHLORIDE 0.9 % IV BOLUS (SEPSIS)
1000.0000 mL | Freq: Once | INTRAVENOUS | Status: AC
  Administered 2013-07-30: 1000 mL via INTRAVENOUS

## 2013-07-30 MED ORDER — PIPERACILLIN-TAZOBACTAM 4.5 G IVPB
4.5000 g | Freq: Once | INTRAVENOUS | Status: AC
  Administered 2013-07-30: 4.5 g via INTRAVENOUS
  Filled 2013-07-30: qty 100

## 2013-07-30 MED ORDER — VANCOMYCIN HCL IN DEXTROSE 1-5 GM/200ML-% IV SOLN: 1000.0000 mg | Freq: Once | INTRAVENOUS | Status: DC

## 2013-07-30 MED ORDER — CIPROFLOXACIN IN D5W 400 MG/200ML IV SOLN
400.0000 mg | Freq: Two times a day (BID) | INTRAVENOUS | Status: DC
  Administered 2013-07-30 – 2013-08-02 (×7): 400 mg via INTRAVENOUS
  Filled 2013-07-30 (×7): qty 200

## 2013-07-30 MED ORDER — BIOTENE DRY MOUTH MT LIQD
15.0000 mL | Freq: Two times a day (BID) | OROMUCOSAL | Status: DC
  Administered 2013-07-30 – 2013-08-01 (×6): 15 mL via OROMUCOSAL

## 2013-07-30 NOTE — Progress Notes (Signed)
Patient ID: Julia Velez, female   DOB: 05-15-1945, 68 y.o.   MRN: [id number]    Subjective: Pain is significantly better today.  No other C/O  Objective: Vital signs in last 24 hours: Temp:  [97.5 F (36.4 C)-98.7 F (37.1 C)] 97.8 F (36.6 C) (05/11 0520) Pulse Rate:  [84-93] 88 (05/11 0520) Resp:  [16-20] 20 (05/11 0520) BP: (112-130)/(65-84) 118/78 mmHg (05/11 0520) SpO2:  [98 %-100 %] 98 % (05/11 0520) Weight:  [145 lb (65.772 kg)] 145 lb (65.772 kg) (05/11 0300)    Intake/Output from previous day: 05/10 0701 - 05/11 0700 In: 718.3 [P.O.:120; I.V.:298.3; IV Piggyback:300] Out: -  Intake/Output this shift: Total I/O In: 300 [I.V.:300] Out: -   General appearance: alert, cooperative and no distress GI: Minimal RLQ tenderness, no guarding Extremities: Homans sign is negative, no sign of DVT Incision/Wound: Clean without drainage or erythema  Lab Results:   Recent Labs  07/29/13 2330 07/30/13 0420  WBC 21.8* 20.4*  HGB 14.3 12.0  HCT 42.0 35.5*  PLT 393 347   BMET  Recent Labs  07/29/13 2330 07/30/13 0420  NA 133* 133*  K 3.7 3.6*  CL 92* 98  CO2 25 22  GLUCOSE 140* 117*  BUN 13 9  CREATININE 0.65 0.64  CALCIUM 9.2 7.4*     Studies/Results: Ct Abdomen Pelvis W Contrast  07/30/2013   CLINICAL DATA:  Recent appendectomy with persistent pain and elevated white blood cell count  EXAM: CT ABDOMEN AND PELVIS WITH CONTRAST  TECHNIQUE: Multidetector CT imaging of the abdomen and pelvis was performed using the standard protocol following bolus administration of intravenous contrast.  CONTRAST:  123mL OMNIPAQUE IOHEXOL 300 MG/ML  SOLN  COMPARISON:  07/19/2013  FINDINGS: Lung bases are free of acute infiltrate or sizable effusion.  The liver, gallbladder, spleen, adrenal glands and pancreas are all normal in their CT appearance and unchanged from the prior study. The kidneys are well visualized bilaterally and reveal no renal calculi or obstructive changes. Renal  cystic changes are seen stable from the prior exam.  The appendix is been surgically removed. There are inflammatory changes in the right lower quadrant medial to the cecum consistent with phlegmon although no organized abscess to allow for drainage is seen. A minimal amount of free pelvic fluid is noted. No pelvic mass lesion is seen. The bladder is well distended. The osseous structures are stable from the prior exam.  IMPRESSION: Changes consistent with phlegmon in the right lower quadrant although no discernible abscess is seen to allow for drainage at this time. Multiple reactive lymph nodes are noted in this region.   Electronically Signed   By: Inez Catalina M.D.   On: 07/30/2013 01:14   Dg Chest Port 1 View  07/30/2013   CLINICAL DATA:  Back pain  EXAM: PORTABLE CHEST - 1 VIEW  COMPARISON:  None.  FINDINGS: The heart size and mediastinal contours are within normal limits. Both lungs are clear. The visualized skeletal structures are unremarkable.  IMPRESSION: No active disease.   Electronically Signed   By: Inez Catalina M.D.   On: 07/30/2013 00:01    Anti-infectives: Anti-infectives   Start     Dose/Rate Route Frequency Ordered Stop   07/30/13 0500  ciprofloxacin (CIPRO) IVPB 400 mg     400 mg 200 mL/hr over 60 Minutes Intravenous Every 12 hours 07/30/13 0344     07/30/13 0400  metroNIDAZOLE (FLAGYL) IVPB 500 mg     500 mg 100 mL/hr  over 60 Minutes Intravenous Every 8 hours 07/30/13 0344     07/30/13 0200  vancomycin (VANCOCIN) IVPB 1000 mg/200 mL premix  Status:  Discontinued     1,000 mg 200 mL/hr over 60 Minutes Intravenous  Once 07/30/13 0146 07/30/13 0344   07/30/13 0200  piperacillin-tazobactam (ZOSYN) IVPB 4.5 g  Status:  Discontinued     4.5 g 200 mL/hr over 30 Minutes Intravenous  Once 07/30/13 0146 07/30/13 0152   07/30/13 0200  piperacillin-tazobactam (ZOSYN) IVPB 4.5 g     4.5 g 200 mL/hr over 30 Minutes Intravenous  Once 07/30/13 0153 07/30/13 0309       Assessment/Plan: RLQ phlegmon 10 days post lap appy.  Seems much improved today though WBC still elevated  Cont current Rx, repeat CBC in AM    LOS: 1 day    Edward Jolly 07/30/2013

## 2013-07-30 NOTE — H&P (Signed)
Julia Velez is an 68 y.o. female.   Chief Complaint: abdominal pain HPI:  The pt is a 68yo wf who had a lap appy for perforated appendicitis about 11 days ago. She was doing well until this evening when she acutely developed RLQ stabbing pain. She denies fever or chill. No nausea or vomiting. Pain was severe so she came to ER. WBC 21.8. CT shows inflammation in RLQ but no drainable fluid.  Past Medical History  Diagnosis Date  . Hypertension   . Hyperthyroidism     Past Surgical History  Procedure Laterality Date  . No past surgeries    . Laparoscopic appendectomy N/A 07/19/2013    Procedure: APPENDECTOMY LAPAROSCOPIC;  Surgeon: Earnstine Regal, MD;  Location: [facility];  Service: General;  Laterality: N/A;    History reviewed. No pertinent family history. Social History:  reports that she has never smoked. She has never used smokeless tobacco. She reports that she drinks alcohol. She reports that she does not use illicit drugs.  Allergies:  Allergies  Allergen Reactions  . 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 Sulfa Antibiotics Rash     (Not in a hospital admission)  Results for orders placed during the hospital encounter of 07/29/13 (from the past 48 hour(s))  CBC WITH DIFFERENTIAL     Status: Abnormal   Collection Time    07/29/13 11:30 PM      Result Value Ref Range   WBC 21.8 (*) 4.0 - 10.5 K/uL   RBC 4.46  3.87 - 5.11 MIL/uL   Hemoglobin 14.3  12.0 - 15.0 g/dL   HCT 42.0  36.0 - 46.0 %   MCV 94.2  78.0 - 100.0 fL   MCH 32.1  26.0 - 34.0 pg   MCHC 34.0  30.0 - 36.0 g/dL   RDW 12.8  11.5 - 15.5 %   Platelets 393  150 - 400 K/uL   Neutrophils Relative % 88 (*) 43 - 77 %   Neutro Abs 19.2 (*) 1.7 - 7.7 K/uL   Lymphocytes Relative 8 (*) 12 - 46 %   Lymphs Abs 1.7  0.7 - 4.0 K/uL   Monocytes Relative 3  3 - 12 %   Monocytes Absolute 0.6  0.1 - 1.0 K/uL   Eosinophils Relative 2  0 - 5 %   Eosinophils Absolute 0.4  0.0 - 0.7 K/uL   Basophils  Relative 0  0 - 1 %   Basophils Absolute 0.0  0.0 - 0.1 K/uL  COMPREHENSIVE METABOLIC PANEL     Status: Abnormal   Collection Time    07/29/13 11:30 PM      Result Value Ref Range   Sodium 133 (*) 137 - 147 mEq/L   Potassium 3.7  3.7 - 5.3 mEq/L   Chloride 92 (*) 96 - 112 mEq/L   CO2 25  19 - 32 mEq/L   Glucose, Bld 140 (*) 70 - 99 mg/dL   BUN 13  6 - 23 mg/dL   Creatinine, Ser 0.65  0.50 - 1.10 mg/dL   Calcium 9.2  8.4 - 10.5 mg/dL   Total Protein 7.5  6.0 - 8.3 g/dL   Albumin 3.9  3.5 - 5.2 g/dL   AST 22  0 - 37 U/L   ALT 40 (*) 0 - 35 U/L   Alkaline Phosphatase 146 (*) 39 - 117 U/L   Total Bilirubin 0.5  0.3 - 1.2  mg/dL   GFR calc non Af Amer >90  >90 mL/min   GFR calc Af Amer >90  >90 mL/min   Comment: (NOTE)     The eGFR has been calculated using the CKD EPI equation.     This calculation has not been validated in all clinical situations.     eGFR's persistently <90 mL/min signify possible Chronic Kidney     Disease.  LIPASE, BLOOD     Status: None   Collection Time    07/29/13 11:30 PM      Result Value Ref Range   Lipase 25  11 - 59 U/L  I-STAT TROPOININ, ED     Status: None   Collection Time    07/29/13 11:41 PM      Result Value Ref Range   Troponin i, poc 0.00  0.00 - 0.08 ng/mL   Comment 3            Comment: Due to the release kinetics of cTnI,     a negative result within the first hours     of the onset of symptoms does not rule out     myocardial infarction with certainty.     If myocardial infarction is still suspected,     repeat the test at appropriate intervals.  URINALYSIS, ROUTINE W REFLEX MICROSCOPIC     Status: Abnormal   Collection Time    07/30/13  2:07 AM      Result Value Ref Range   Color, Urine YELLOW  YELLOW   APPearance CLEAR  CLEAR   Specific Gravity, Urine 1.039 (*) 1.005 - 1.030   pH 6.0  5.0 - 8.0   Glucose, UA NEGATIVE  NEGATIVE mg/dL   Hgb urine dipstick NEGATIVE  NEGATIVE   Bilirubin Urine NEGATIVE  NEGATIVE   Ketones, ur  NEGATIVE  NEGATIVE mg/dL   Protein, ur NEGATIVE  NEGATIVE mg/dL   Urobilinogen, UA 0.2  0.0 - 1.0 mg/dL   Nitrite NEGATIVE  NEGATIVE   Leukocytes, UA NEGATIVE  NEGATIVE   Comment: MICROSCOPIC NOT DONE ON URINES WITH NEGATIVE PROTEIN, BLOOD, LEUKOCYTES, NITRITE, OR GLUCOSE <1000 mg/dL.   Ct Abdomen Pelvis W Contrast  07/30/2013   CLINICAL DATA:  Recent appendectomy with persistent pain and elevated white blood cell count  EXAM: CT ABDOMEN AND PELVIS WITH CONTRAST  TECHNIQUE: Multidetector CT imaging of the abdomen and pelvis was performed using the standard protocol following bolus administration of intravenous contrast.  CONTRAST:  187mL OMNIPAQUE IOHEXOL 300 MG/ML  SOLN  COMPARISON:  07/19/2013  FINDINGS: Lung bases are free of acute infiltrate or sizable effusion.  The liver, gallbladder, spleen, adrenal glands and pancreas are all normal in their CT appearance and unchanged from the prior study. The kidneys are well visualized bilaterally and reveal no renal calculi or obstructive changes. Renal cystic changes are seen stable from the prior exam.  The appendix is been surgically removed. There are inflammatory changes in the right lower quadrant medial to the cecum consistent with phlegmon although no organized abscess to allow for drainage is seen. A minimal amount of free pelvic fluid is noted. No pelvic mass lesion is seen. The bladder is well distended. The osseous structures are stable from the prior exam.  IMPRESSION: Changes consistent with phlegmon in the right lower quadrant although no discernible abscess is seen to allow for drainage at this time. Multiple reactive lymph nodes are noted in this region.   Electronically Signed   By: Linus Mako.D.  On: 07/30/2013 01:14   Dg Chest Port 1 View  07/30/2013   CLINICAL DATA:  Back pain  EXAM: PORTABLE CHEST - 1 VIEW  COMPARISON:  None.  FINDINGS: The heart size and mediastinal contours are within normal limits. Both lungs are clear. The  visualized skeletal structures are unremarkable.  IMPRESSION: No active disease.   Electronically Signed   By: Inez Catalina M.D.   On: 07/30/2013 00:01    Review of Systems  Constitutional: Negative.   HENT: Negative.   Eyes: Negative.   Respiratory: Negative.   Cardiovascular: Negative.   Gastrointestinal: Positive for abdominal pain. Negative for nausea and vomiting.  Genitourinary: Negative.   Musculoskeletal: Negative.   Skin: Negative.   Neurological: Negative.   Endo/Heme/Allergies: Negative.   Psychiatric/Behavioral: Negative.     Blood pressure 130/84, pulse 84, temperature 97.5 F (36.4 C), temperature source Oral, resp. rate 18, SpO2 100.00%. Physical Exam  Constitutional: She is oriented to person, place, and time. She appears well-developed and well-nourished.  HENT:  Head: Normocephalic and atraumatic.  Eyes: Conjunctivae and EOM are normal. Pupils are equal, round, and reactive to light.  Neck: Normal range of motion. Neck supple.  Cardiovascular: Normal rate, regular rhythm and normal heart sounds.   Respiratory: Effort normal and breath sounds normal.  GI: Soft. Bowel sounds are normal.  Focal RLQ pain but no peritonitis  Musculoskeletal: Normal range of motion.  Neurological: She is alert and oriented to person, place, and time.  Skin: Skin is warm and dry.  Psychiatric: She has a normal mood and affect. Her behavior is normal.     Assessment/Plan She appears to have a develping phlegmon in RLQ. No indication for urgent surgery. Will admit for IV abx and monitor  Luella Cook III 07/30/2013, 2:46 AM

## 2013-07-31 LAB — CBC
HCT: 34 % — ABNORMAL LOW (ref 36.0–46.0)
Hemoglobin: 11.2 g/dL — ABNORMAL LOW (ref 12.0–15.0)
MCH: 31.5 pg (ref 26.0–34.0)
MCHC: 32.9 g/dL (ref 30.0–36.0)
MCV: 95.8 fL (ref 78.0–100.0)
PLATELETS: 293 10*3/uL (ref 150–400)
RBC: 3.55 MIL/uL — ABNORMAL LOW (ref 3.87–5.11)
RDW: 13 % (ref 11.5–15.5)
WBC: 9.5 10*3/uL (ref 4.0–10.5)

## 2013-07-31 MED ORDER — FLUCONAZOLE 200 MG PO TABS
200.0000 mg | ORAL_TABLET | Freq: Once | ORAL | Status: AC
  Administered 2013-07-31: 200 mg via ORAL
  Filled 2013-07-31: qty 1

## 2013-07-31 MED ORDER — SACCHAROMYCES BOULARDII 250 MG PO CAPS
250.0000 mg | ORAL_CAPSULE | Freq: Two times a day (BID) | ORAL | Status: DC
  Administered 2013-07-31 – 2013-08-01 (×3): 250 mg via ORAL
  Filled 2013-07-31 (×7): qty 1

## 2013-07-31 MED ORDER — PANTOPRAZOLE SODIUM 40 MG PO TBEC
40.0000 mg | DELAYED_RELEASE_TABLET | Freq: Every day | ORAL | Status: DC
  Administered 2013-07-31 – 2013-08-01 (×2): 40 mg via ORAL
  Filled 2013-07-31 (×3): qty 1

## 2013-07-31 NOTE — Progress Notes (Signed)
Patient ID: Julia Velez, female   DOB: Jun 11, 1945, 68 y.o.   MRN: [id number]    Subjective: Pt feels well today.  I think she was wanting to go home.  Tolerating clear liquids.  No pain.  Loose stools when having a BM, but only 1-2 times yesterday  Objective: Vital signs in last 24 hours: Temp:  [97.5 F (36.4 C)-98.2 F (36.8 C)] 97.8 F (36.6 C) (05/12 0550) Pulse Rate:  [69-80] 80 (05/12 0550) Resp:  [16-18] 18 (05/12 0550) BP: (110-145)/(69-83) 121/79 mmHg (05/12 0550) SpO2:  [96 %-99 %] 96 % (05/12 0550)    Intake/Output from previous day: 05/11 0701 - 05/12 0700 In: 5102 [P.O.:610; I.V.:1945; IV Piggyback:700] Out: -  Intake/Output this shift:    PE: Abd: soft, Nt, ND, +BS Heart: regular Lungs: CTAB  Lab Results:   Recent Labs  07/30/13 0420 07/31/13 0510  WBC 20.4* 9.5  HGB 12.0 11.2*  HCT 35.5* 34.0*  PLT 347 293   BMET  Recent Labs  07/29/13 2330 07/30/13 0420  NA 133* 133*  K 3.7 3.6*  CL 92* 98  CO2 25 22  GLUCOSE 140* 117*  BUN 13 9  CREATININE 0.65 0.64  CALCIUM 9.2 7.4*   PT/INR No results found for this basename: LABPROT, INR,  in the last 72 hours CMP     Component Value Date/Time   NA 133* 07/30/2013 0420   K 3.6* 07/30/2013 0420   CL 98 07/30/2013 0420   CO2 22 07/30/2013 0420   GLUCOSE 117* 07/30/2013 0420   BUN 9 07/30/2013 0420   CREATININE 0.64 07/30/2013 0420   CALCIUM 7.4* 07/30/2013 0420   PROT 7.5 07/29/2013 2330   ALBUMIN 3.9 07/29/2013 2330   AST 22 07/29/2013 2330   ALT 40* 07/29/2013 2330   ALKPHOS 146* 07/29/2013 2330   BILITOT 0.5 07/29/2013 2330   GFRNONAA >90 07/30/2013 0420   GFRAA >90 07/30/2013 0420   Lipase     Component Value Date/Time   LIPASE 25 07/29/2013 2330       Studies/Results: Ct Abdomen Pelvis W Contrast  07/30/2013   CLINICAL DATA:  Recent appendectomy with persistent pain and elevated white blood cell count  EXAM: CT ABDOMEN AND PELVIS WITH CONTRAST  TECHNIQUE: Multidetector CT imaging of the  abdomen and pelvis was performed using the standard protocol following bolus administration of intravenous contrast.  CONTRAST:  178mL OMNIPAQUE IOHEXOL 300 MG/ML  SOLN  COMPARISON:  07/19/2013  FINDINGS: Lung bases are free of acute infiltrate or sizable effusion.  The liver, gallbladder, spleen, adrenal glands and pancreas are all normal in their CT appearance and unchanged from the prior study. The kidneys are well visualized bilaterally and reveal no renal calculi or obstructive changes. Renal cystic changes are seen stable from the prior exam.  The appendix is been surgically removed. There are inflammatory changes in the right lower quadrant medial to the cecum consistent with phlegmon although no organized abscess to allow for drainage is seen. A minimal amount of free pelvic fluid is noted. No pelvic mass lesion is seen. The bladder is well distended. The osseous structures are stable from the prior exam.  IMPRESSION: Changes consistent with phlegmon in the right lower quadrant although no discernible abscess is seen to allow for drainage at this time. Multiple reactive lymph nodes are noted in this region.   Electronically Signed   By: Inez Catalina M.D.   On: 07/30/2013 01:14   Dg Chest [facility]  07/30/2013   CLINICAL DATA:  Back pain  EXAM: PORTABLE CHEST - 1 VIEW  COMPARISON:  None.  FINDINGS: The heart size and mediastinal contours are within normal limits. Both lungs are clear. The visualized skeletal structures are unremarkable.  IMPRESSION: No active disease.   Electronically Signed   By: Inez Catalina M.D.   On: 07/30/2013 00:01    Anti-infectives: Anti-infectives   Start     Dose/Rate Route Frequency Ordered Stop   07/30/13 0500  ciprofloxacin (CIPRO) IVPB 400 mg     400 mg 200 mL/hr over 60 Minutes Intravenous Every 12 hours 07/30/13 0344     07/30/13 0400  metroNIDAZOLE (FLAGYL) IVPB 500 mg     500 mg 100 mL/hr over 60 Minutes Intravenous Every 8 hours 07/30/13 0344     07/30/13  0200  vancomycin (VANCOCIN) IVPB 1000 mg/200 mL premix  Status:  Discontinued     1,000 mg 200 mL/hr over 60 Minutes Intravenous  Once 07/30/13 0146 07/30/13 0344   07/30/13 0200  piperacillin-tazobactam (ZOSYN) IVPB 4.5 g  Status:  Discontinued     4.5 g 200 mL/hr over 30 Minutes Intravenous  Once 07/30/13 0146 07/30/13 0152   07/30/13 0200  piperacillin-tazobactam (ZOSYN) IVPB 4.5 g     4.5 g 200 mL/hr over 30 Minutes Intravenous  Once 07/30/13 0153 07/30/13 0309       Assessment/Plan  1. S/p lap appy POD 11 with RLQ phlegmon  Plan: 1. WBC normal today.  Feels well.  Will advance to regular diet and continue one more day of IV abx therapy.  If does well, hopefully can go home tomorrow.  She was wanting to go home today.  LOS: 2 days    Henreitta Cea 07/31/2013, 7:44 AM Pager: [phone]

## 2013-07-31 NOTE — Progress Notes (Signed)
This patient is receiving pantoprazole. Based on criteria approved by the Pharmacy and Therapeutics Committee, this medication is being converted to the equivalent oral dose form. These criteria include:   . The patient is eating (either orally or per tube) and/or has been taking other orally administered medications for at least 24 hours.  . This patient has no evidence of active gastrointestinal bleeding or impaired GI absorption (gastrectomy, short bowel, patient on TNA or NPO).   If you have questions about this conversion, please contact the pharmacy department (ext [phone])  [place], [facility] 07/31/2013 10:08 AM

## 2013-07-31 NOTE — Progress Notes (Signed)
Patient interviewed and examined, agree with PA note above. Complained to me of some vaginal irritation consistent with yeast infection. Will treat with Diflucan.  Edward Jolly MD, FACS  07/31/2013 8:40 AM

## 2013-08-01 LAB — CBC
HCT: 39 % (ref 36.0–46.0)
HEMOGLOBIN: 12.9 g/dL (ref 12.0–15.0)
MCH: 31.6 pg (ref 26.0–34.0)
MCHC: 33.1 g/dL (ref 30.0–36.0)
MCV: 95.6 fL (ref 78.0–100.0)
Platelets: 368 10*3/uL (ref 150–400)
RBC: 4.08 MIL/uL (ref 3.87–5.11)
RDW: 13 % (ref 11.5–15.5)
WBC: 10.9 10*3/uL — ABNORMAL HIGH (ref 4.0–10.5)

## 2013-08-01 NOTE — Progress Notes (Signed)
Patient ID: Julia Velez, female   DOB: 1945-06-02, 68 y.o.   MRN: [id number]    Subjective: No C/O this AM, denies pain or nausea.  Better spirits  Objective: Vital signs in last 24 hours: Temp:  [97.6 F (36.4 C)-98.9 F (37.2 C)] 98.8 F (37.1 C) (05/13 0500) Pulse Rate:  [70-92] 78 (05/13 0500) Resp:  [16] 16 (05/13 0500) BP: (123-136)/(76-85) 123/76 mmHg (05/13 0500) SpO2:  [94 %-99 %] 94 % (05/13 0500) Last BM Date: 07/31/13  Intake/Output from previous day: 05/12 0701 - 05/13 0700 In: 1120 [P.O.:720; IV Piggyback:400] Out: -  Intake/Output this shift:    General appearance: alert, cooperative and no distress GI: abnormal findings:  mild tenderness in the RLQ  Lab Results:   Recent Labs  07/30/13 0420 07/31/13 0510  WBC 20.4* 9.5  HGB 12.0 11.2*  HCT 35.5* 34.0*  PLT 347 293   BMET  Recent Labs  07/29/13 2330 07/30/13 0420  NA 133* 133*  K 3.7 3.6*  CL 92* 98  CO2 25 22  GLUCOSE 140* 117*  BUN 13 9  CREATININE 0.65 0.64  CALCIUM 9.2 7.4*     Studies/Results: No results found.  Anti-infectives: Anti-infectives   Start     Dose/Rate Route Frequency Ordered Stop   07/31/13 1000  fluconazole (DIFLUCAN) tablet 200 mg     200 mg Oral  Once 07/31/13 0842 07/31/13 1032   07/30/13 0500  ciprofloxacin (CIPRO) IVPB 400 mg     400 mg 200 mL/hr over 60 Minutes Intravenous Every 12 hours 07/30/13 0344     07/30/13 0400  metroNIDAZOLE (FLAGYL) IVPB 500 mg     500 mg 100 mL/hr over 60 Minutes Intravenous Every 8 hours 07/30/13 0344     07/30/13 0200  vancomycin (VANCOCIN) IVPB 1000 mg/200 mL premix  Status:  Discontinued     1,000 mg 200 mL/hr over 60 Minutes Intravenous  Once 07/30/13 0146 07/30/13 0344   07/30/13 0200  piperacillin-tazobactam (ZOSYN) IVPB 4.5 g  Status:  Discontinued     4.5 g 200 mL/hr over 30 Minutes Intravenous  Once 07/30/13 0146 07/30/13 0152   07/30/13 0200  piperacillin-tazobactam (ZOSYN) IVPB 4.5 g     4.5 g 200 mL/hr over  30 Minutes Intravenous  Once 07/30/13 0153 07/30/13 0309      Assessment/Plan: RLQ phlegmon 2 weeks p lap appy. Much improved on IV abx but still mild RLQ tenderness Cont IV abx today.  Prob home in AM if continued improvement    LOS: 3 days    Edward Jolly 08/01/2013

## 2013-08-02 MED ORDER — METRONIDAZOLE 500 MG PO TABS: 500.0000 mg | ORAL_TABLET | Freq: Three times a day (TID) | ORAL | Status: DC

## 2013-08-02 MED ORDER — CIPROFLOXACIN HCL 500 MG PO TABS: 500.0000 mg | ORAL_TABLET | Freq: Two times a day (BID) | ORAL | Status: DC

## 2013-08-02 MED ORDER — OXYCODONE HCL 5 MG PO TABS: 5.0000 mg | ORAL_TABLET | ORAL | Status: DC | PRN

## 2013-08-02 MED ORDER — METRONIDAZOLE 500 MG PO TABS
500.0000 mg | ORAL_TABLET | Freq: Three times a day (TID) | ORAL | Status: DC
  Filled 2013-08-02 (×3): qty 1

## 2013-08-02 MED ORDER — SACCHAROMYCES BOULARDII 250 MG PO CAPS: ORAL_CAPSULE | ORAL | Status: DC

## 2013-08-02 MED ORDER — CIPROFLOXACIN HCL 500 MG PO TABS
500.0000 mg | ORAL_TABLET | Freq: Two times a day (BID) | ORAL | Status: DC
  Filled 2013-08-02 (×2): qty 1

## 2013-08-02 NOTE — Discharge Instructions (Signed)
Laparoscopic Appendectomy Care After Refer to this sheet in the next few weeks. These instructions provide you with information on caring for yourself after your procedure. Your caregiver may also give you more specific instructions. Your treatment has been planned according to current medical practices, but problems sometimes occur. Call your caregiver if you have any problems or questions after your procedure. HOME CARE INSTRUCTIONS  Do not drive while taking narcotic pain medicines.  Use stool softener if you become constipated from your pain medicines.  Change your bandages (dressings) as directed.  Keep your wounds clean and dry. You may wash the wounds gently with soap and water. Gently pat the wounds dry with a clean towel.  Do not take baths, swim, or use hot tubs for 10 days, or as instructed by your caregiver.  Only take over-the-counter or prescription medicines for pain, discomfort, or fever as directed by your caregiver.  You may continue your normal diet as directed.  Do not lift more than 10 pounds (4.5 kg) or play contact sports for 3 weeks, or as directed.  Slowly increase your activity after surgery.  Take deep breaths to avoid getting a lung infection (pneumonia). SEEK MEDICAL CARE IF:  You have redness, swelling, or increasing pain in your wounds.  You have pus coming from your wounds.  You have drainage from a wound that lasts longer than 1 day.  You notice a bad smell coming from the wounds or dressing.  Your wound edges break open after stitches (sutures) have been removed.  You notice increasing pain in the shoulders (shoulder strap areas) or near your shoulder blades.  You develop dizzy episodes or fainting while standing.  You develop shortness of breath.  You develop persistent nausea or vomiting.  You cannot control your bowel functions or lose your appetite.  You develop diarrhea. SEEK IMMEDIATE MEDICAL CARE IF:   You have a fever.  You  develop a rash.  You have difficulty breathing or sharp pains in your chest.  You develop any reaction or side effects to medicines given. MAKE SURE YOU:  Understand these instructions.  Will watch your condition.  Will get help right away if you are not doing well or get worse. Document Released: 03/08/2005 Document Revised: 05/31/2011 Document Reviewed: 09/15/2010 [facility] Patient Information 2014 [place], [place].  [facility] ______[facility] LAPAROSCOPIC SURGERY: POST OP INSTRUCTIONS Always review your discharge instruction sheet given to you by the facility where your surgery was performed. IF YOU HAVE DISABILITY OR FAMILY LEAVE FORMS, YOU MUST BRING THEM TO THE OFFICE FOR PROCESSING.   DO NOT GIVE THEM TO YOUR DOCTOR.  1. A prescription for pain medication may be given to you upon discharge.  Take your pain medication as prescribed, if needed.  If narcotic pain medicine is not needed, then you may take acetaminophen (Tylenol) or ibuprofen (Advil) as needed. 2. Take your usually prescribed medications unless otherwise directed. 3. If you need a refill on your pain medication, please contact your pharmacy.  They will contact our office to request authorization. Prescriptions will not be filled after 5pm or on week-ends. 4. You should follow a light diet the first few days after arrival home, such as soup and crackers, etc.  Be sure to include lots of fluids daily. 5. Most patients will experience some swelling and bruising in the area of the incisions.  Ice packs will help.  Swelling and bruising can take several days to resolve.  6. It is common to experience  some constipation if taking pain medication after surgery.  Increasing fluid intake and taking a stool softener (such as Colace) will usually help or prevent this problem from occurring.  A mild laxative (Milk of Magnesia or Miralax) should be taken according to package instructions if there are no bowel movements  after 48 hours. 7. Unless discharge instructions indicate otherwise, you may remove your bandages 24-48 hours after surgery, and you may shower at that time.  You may have steri-strips (small skin tapes) in place directly over the incision.  These strips should be left on the skin for 7-10 days.  If your surgeon used skin glue on the incision, you may shower in 24 hours.  The glue will flake off over the next 2-3 weeks.  Any sutures or staples will be removed at the office during your follow-up visit. 8. ACTIVITIES:  You may resume regular (light) daily activities beginning the next day--such as daily self-care, walking, climbing stairs--gradually increasing activities as tolerated.  You may have sexual intercourse when it is comfortable.  Refrain from any heavy lifting or straining until approved by your doctor. a. You may drive when you are no longer taking prescription pain medication, you can comfortably wear a seatbelt, and you can safely maneuver your car and apply brakes. b. RETURN TO WORK:  __________________________________________________________ 9. You should see your doctor in the office for a follow-up appointment approximately 2-3 weeks after your surgery.  Make sure that you call for this appointment within a day or two after you arrive home to insure a convenient appointment time. 10. OTHER INSTRUCTIONS: __________________________________________________________________________________________________________________________ __________________________________________________________________________________________________________________________ WHEN TO CALL YOUR DOCTOR: 1. Fever over 101.0 2. Inability to urinate 3. Continued bleeding from incision. 4. Increased pain, redness, or drainage from the incision. 5. Increasing abdominal pain  The clinic staff is available to answer your questions during regular business hours.  Please dont hesitate to call and ask to speak to one of the  nurses for clinical concerns.  If you have a medical emergency, go to the nearest emergency room or call 911.  A surgeon from [facility] is always on call at the hospital. [address] [place], [place], [place]  [address] ? [address], [place], [place]   [phone] ? [phone] ? FAX [phone] Web site: [email or web address]  YOU CAN SHOWER ANYTIME.

## 2013-08-02 NOTE — Discharge Summary (Signed)
Physician Discharge Summary  Patient ID: Julia Velez MRN: [id number] DOB/AGE: 06/28/1945 68 y.o.  Admit date: 07/29/2013 Discharge date: 08/02/2013  Admission Diagnoses:    1. Acute appendicitis with perforation and peritonitis  S/p Laparoscopic appendectomy 07/19/13, Dr. Harlow Asa  Readmitted with phlegmon RLQ 07/30/13  2. GERD  3.Hypertension  4. Hypothyroid  5. Migraines  Discharge Diagnoses:  SAME Active Problems:   Postoperative abdominal pain   PROCEDURES: NONE  Hospital Course: The pt is a 68yo wf who had a lap appy for perforated appendicitis about 11 days ago. She was doing well until this evening when she acutely developed RLQ stabbing pain. She denies fever or chill. No nausea or vomiting. Pain was severe so she came to ER. WBC 21.8. CT shows inflammation in RLQ but no drainable fluid. Pt. Was seen in the ED by Dr. Marlou Starks and admitted.  She was discharged on Augmentin, so she was started on IV Cipro, and Flagyl.  After 4 days on this combination her WBC has improved and her pain is gone.  She is tolerating a regular diet and having some loose stools.  She feels much better.  She is now on probiotic to help restore normal flora.  She is for discharge today. We plan 7 more days of Cipro and Flagyl.  She has a follow up appointment with Dr. Harlow Asa for June 3, and I have told her to keep that.  She is to call right away if she has further symptoms, so we can see her sooner.   Condition on d/c:  Improved  Disposition: 01-Home or Self Care   Future Appointments Provider Department Dept Phone   08/22/2013 11:15 AM Earnstine Regal, MD [facility], [place] [phone]       Medication List         [facility] FAST PAIN RELIEF [phone] MG Pack  Generic drug:  Aspirin-Salicylamide-Caffeine  Take 1 Package by mouth as needed (headache).     calcium carbonate 1250 MG tablet  Commonly known as:  OS-CAL - dosed in mg of elemental calcium  Take 1 tablet by mouth daily with  breakfast.     cholecalciferol 1000 UNITS tablet  Commonly known as:  VITAMIN D  Take 1,000 Units by mouth daily.     Cinnamon 500 MG capsule  Take 100 mg by mouth daily.     ciprofloxacin 500 MG tablet  Commonly known as:  CIPRO  Take 1 tablet (500 mg total) by mouth 2 (two) times daily.     metroNIDAZOLE 500 MG tablet  Commonly known as:  FLAGYL  Take 1 tablet (500 mg total) by mouth every 8 (eight) hours.     oxyCODONE 5 MG immediate release tablet  Commonly known as:  Oxy IR/ROXICODONE  Take 1 tablet (5 mg total) by mouth every 4 (four) hours as needed for moderate pain.     ranitidine 150 MG tablet  Commonly known as:  ZANTAC  Take 150 mg by mouth every morning.     saccharomyces boulardii 250 MG capsule  Commonly known as:  FLORASTOR  - Take one twice a day till you are off antibiotics and your stools are back to normal.  - You can buy this over the counter at any drug store.     SUMAtriptan 25 MG tablet  Commonly known as:  IMITREX  Take 25 mg by mouth every 2 (two) hours as needed for migraine or headache. May repeat in 2 hours if headache persists or  recurs.     triamterene-hydrochlorothiazide 37.5-25 MG per tablet  Commonly known as:  MAXZIDE-25  Take 1 tablet by mouth daily.     vitamin B-12 1000 MCG tablet  Commonly known as:  CYANOCOBALAMIN  Take 1,000 mcg by mouth daily.           Follow-up Information   Follow up with Earnstine Regal, MD On 08/22/2013. (Your appointment is at 11:15, be there about 20 minutes early for check in.  If you have any further problems, call and let us see you sooner.)    Specialty:  General Surgery   Contact information:   [place] [address] [phone]       Follow up with Vidal Schwalbe, MD. (Let her know what has happened and let her follow up with you as needed.)    Specialty:  Family Medicine   Contact information:   [address], [place] [address] [phone]        Signed: Earnstine Regal 08/02/2013, 7:55 AM

## 2013-08-02 NOTE — Progress Notes (Signed)
  Subjective: Doing well, still a little tearful over events.  She's mad at herself for not coming when she first got sick.  We discussed follow up she has an appointment for 6/3 with Dr. Harlow Asa.  i told her to keep that if she was doing well.  If she has any problems, call and come back sooner. She had a migraine and issues with her IV last night that made it a bad night. Objective: Vital signs in last 24 hours: Temp:  [97.9 F (36.6 C)-98.3 F (36.8 C)] 98.3 F (36.8 C) (05/14 0558) Pulse Rate:  [79-80] 80 (05/14 0558) Resp:  [18] 18 (05/14 0558) BP: (129-132)/(78-87) 129/78 mmHg (05/14 0558) SpO2:  [96 %-98 %] 96 % (05/14 0558) Last BM Date: 08/01/13 540 PO recorded Regular diet Afebrile, VSS No labs today Intake/Output from previous day: 05/13 0701 - 05/14 0700 In: 940 [P.O.:540; IV Piggyback:400] Out: -  Intake/Output this shift:    General appearance: alert, cooperative and no distress GI: soft, non-tender; bowel sounds normal; no masses,  no organomegaly  Lab Results:   Recent Labs  07/31/13 0510 08/01/13 0639  WBC 9.5 10.9*  HGB 11.2* 12.9  HCT 34.0* 39.0  PLT 293 368    BMET No results found for this basename: NA, K, CL, CO2, GLUCOSE, BUN, CREATININE, CALCIUM,  in the last 72 hours PT/INR No results found for this basename: LABPROT, INR,  in the last 72 hours   Recent Labs Lab 07/29/13 2330  AST 22  ALT 40*  ALKPHOS 146*  BILITOT 0.5  PROT 7.5  ALBUMIN 3.9     Lipase     Component Value Date/Time   LIPASE 25 07/29/2013 2330     Studies/Results: No results found.  Medications: . antiseptic oral rinse  15 mL Mouth Rinse q12n4p  . chlorhexidine  15 mL Mouth Rinse BID  . ciprofloxacin  400 mg Intravenous Q12H  . heparin  5,000 Units Subcutaneous 3 times per day  . lactated ringers  1,000 mL Intravenous Once  . metronidazole  500 mg Intravenous Q8H  . pantoprazole  40 mg Oral Daily  . saccharomyces boulardii  250 mg Oral BID  .  triamterene-hydrochlorothiazide  1 tablet Oral Daily    Assessment/Plan 1. Acute appendicitis with perforation and peritonitis  S/p Laparoscopic appendectomy 07/19/13, Dr. Harlow Asa  Readmitted with phlegmon RLQ 07/30/13 2. GERD  3.Hypertension  4. Hypothyroid  5. Migraines   Plan:  We will let her go home today on 7 more days of Flagyl and Cipro.  She will follow up in our office on 08/22/13 with Dr. Harlow Asa, sooner if there is a new problem.   LOS: 4 days    Earnstine Regal 08/02/2013

## 2013-08-02 NOTE — Progress Notes (Signed)
RN reviewed discharge instructions with patient. All questions answered.   RN reviewed antibiotic regimen, probiotic medication, and reasons to call the MD or seek immediate medical attention.   Paperwork and prescriptions given.   Patient ambulated with husband out to family car.

## 2013-08-06 ENCOUNTER — Telehealth (INDEPENDENT_AMBULATORY_CARE_PROVIDER_SITE_OTHER): Payer: Self-pay

## 2013-08-06 ENCOUNTER — Other Ambulatory Visit (INDEPENDENT_AMBULATORY_CARE_PROVIDER_SITE_OTHER): Payer: Self-pay

## 2013-08-06 MED ORDER — ONDANSETRON 4 MG PO TBDP: 4.0000 mg | ORAL_TABLET | Freq: Four times a day (QID) | ORAL | Status: DC | PRN

## 2013-08-06 NOTE — Telephone Encounter (Signed)
Pt s/p lap appy on 07/19/13. Pt was readmitted on 0/81/44 for complications. Pt was discharged home on 08/02/13 with abx, Augmentin and Flagyl. Pt has had nausea since taking these meds. Spoke with Dr Marlou Starks, he states to call in [place] 4mg  per protocol. Informed pt that we would call in her some Zofran 4mg  1 tablet every 6 hours as needed for nausea. Pt verbalized understanding and agrees with POC.

## 2013-08-22 ENCOUNTER — Ambulatory Visit (INDEPENDENT_AMBULATORY_CARE_PROVIDER_SITE_OTHER): Payer: Medicare Other | Admitting: Surgery

## 2013-08-22 ENCOUNTER — Encounter (INDEPENDENT_AMBULATORY_CARE_PROVIDER_SITE_OTHER): Payer: Self-pay | Admitting: Surgery

## 2013-08-22 VITALS — BP 134/86 | HR 76 | Temp 97.9°F | Ht 61.0 in | Wt 145.0 lb

## 2013-08-22 DIAGNOSIS — K3532 Acute appendicitis with perforation and localized peritonitis, without abscess: Secondary | ICD-10-CM

## 2013-08-22 DIAGNOSIS — K352 Acute appendicitis with generalized peritonitis, without abscess: Secondary | ICD-10-CM

## 2013-08-22 NOTE — Patient Instructions (Signed)
  CARE OF INCISION   Apply cocoa butter/vitamin E cream (Palmer's brand) to your incision 2 - 3 times daily.  Massage cream into incision for one minute with each application.  Use sunscreen (50 SPF or higher) for first 6 months after surgery if area is exposed to sun.  You may alternate Mederma or other scar reducing cream with cocoa butter cream if desired.       Sanjith Siwek M. Everlynn Sagun, MD, FACS      [facility]      Office: [phone]    

## 2013-08-22 NOTE — Progress Notes (Signed)
General Surgery [facility]  Chief Complaint  Patient presents with  . Routine Post Op    perforated appendicitis on 07/19/2013    HISTORY: Patient is a 68 year old female who underwent laparoscopic appendectomy for acute appendicitis on 07/19/2013. Postoperative course was complicated due to perforation. She received additional intravenous antibiotics and was discharged home on oral antibiotics. She returns today for evaluation.  EXAM: Surgical wounds have healed nicely. No sign of infection. No sign of herniation. Right lower quadrant is soft and nontender without palpable abnormality.  IMPRESSION: Personal history of perforated acute appendicitis, resolved  PLAN: Patient is instructed in activity level and use of topical creams on her incisions.   Patient will return for surgical care as needed.  Earnstine Regal, MD, [facility]   Visit Diagnoses: 1. Perforated appendicitis

## 2013-09-12 DIAGNOSIS — L80 Vitiligo: Secondary | ICD-10-CM | POA: Diagnosis not present

## 2013-09-12 DIAGNOSIS — L570 Actinic keratosis: Secondary | ICD-10-CM | POA: Diagnosis not present

## 2013-10-22 ENCOUNTER — Other Ambulatory Visit: Payer: Self-pay

## 2013-10-22 DIAGNOSIS — Z1231 Encounter for screening mammogram for malignant neoplasm of breast: Secondary | ICD-10-CM

## 2013-11-02 ENCOUNTER — Encounter (INDEPENDENT_AMBULATORY_CARE_PROVIDER_SITE_OTHER): Payer: Self-pay

## 2013-11-02 ENCOUNTER — Ambulatory Visit
Admission: RE | Admit: 2013-11-02 | Discharge: 2013-11-02 | Disposition: A | Discharge status: Home / Self Care | Payer: Medicare Other | Source: Ambulatory Visit

## 2013-11-02 DIAGNOSIS — Z1231 Encounter for screening mammogram for malignant neoplasm of breast: Secondary | ICD-10-CM

## 2014-01-02 DIAGNOSIS — Z23 Encounter for immunization: Secondary | ICD-10-CM | POA: Diagnosis not present

## 2014-01-02 DIAGNOSIS — K219 Gastro-esophageal reflux disease without esophagitis: Secondary | ICD-10-CM | POA: Diagnosis not present

## 2014-01-02 DIAGNOSIS — E663 Overweight: Secondary | ICD-10-CM | POA: Diagnosis not present

## 2014-01-02 DIAGNOSIS — M79645 Pain in left finger(s): Secondary | ICD-10-CM | POA: Diagnosis not present

## 2014-01-02 DIAGNOSIS — I10 Essential (primary) hypertension: Secondary | ICD-10-CM | POA: Diagnosis not present

## 2014-01-21 DIAGNOSIS — E0500 Thyrotoxicosis with diffuse goiter without thyrotoxic crisis or storm: Secondary | ICD-10-CM | POA: Diagnosis not present

## 2014-01-23 DIAGNOSIS — E0590 Thyrotoxicosis, unspecified without thyrotoxic crisis or storm: Secondary | ICD-10-CM | POA: Diagnosis not present

## 2014-01-23 DIAGNOSIS — E042 Nontoxic multinodular goiter: Secondary | ICD-10-CM | POA: Diagnosis not present

## 2014-04-30 DIAGNOSIS — R05 Cough: Secondary | ICD-10-CM | POA: Diagnosis not present

## 2014-04-30 DIAGNOSIS — K219 Gastro-esophageal reflux disease without esophagitis: Secondary | ICD-10-CM | POA: Diagnosis not present

## 2014-06-28 DIAGNOSIS — R197 Diarrhea, unspecified: Secondary | ICD-10-CM | POA: Diagnosis not present

## 2014-07-09 DIAGNOSIS — K219 Gastro-esophageal reflux disease without esophagitis: Secondary | ICD-10-CM | POA: Diagnosis not present

## 2014-07-09 DIAGNOSIS — I10 Essential (primary) hypertension: Secondary | ICD-10-CM | POA: Diagnosis not present

## 2014-07-09 DIAGNOSIS — Z0000 Encounter for general adult medical examination without abnormal findings: Secondary | ICD-10-CM | POA: Diagnosis not present

## 2014-07-09 DIAGNOSIS — Z23 Encounter for immunization: Secondary | ICD-10-CM | POA: Diagnosis not present

## 2014-07-22 DIAGNOSIS — E042 Nontoxic multinodular goiter: Secondary | ICD-10-CM | POA: Diagnosis not present

## 2014-07-26 DIAGNOSIS — E0590 Thyrotoxicosis, unspecified without thyrotoxic crisis or storm: Secondary | ICD-10-CM | POA: Diagnosis not present

## 2014-07-26 DIAGNOSIS — E042 Nontoxic multinodular goiter: Secondary | ICD-10-CM | POA: Diagnosis not present

## 2014-09-18 DIAGNOSIS — L570 Actinic keratosis: Secondary | ICD-10-CM | POA: Diagnosis not present

## 2014-09-18 DIAGNOSIS — Z8522 Personal history of malignant neoplasm of nasal cavities, middle ear, and accessory sinuses: Secondary | ICD-10-CM | POA: Diagnosis not present

## 2014-09-18 DIAGNOSIS — L80 Vitiligo: Secondary | ICD-10-CM | POA: Diagnosis not present

## 2014-09-18 DIAGNOSIS — L820 Inflamed seborrheic keratosis: Secondary | ICD-10-CM | POA: Diagnosis not present

## 2014-11-05 ENCOUNTER — Other Ambulatory Visit: Payer: Self-pay

## 2014-11-05 DIAGNOSIS — Z1231 Encounter for screening mammogram for malignant neoplasm of breast: Secondary | ICD-10-CM

## 2014-11-08 ENCOUNTER — Ambulatory Visit: Payer: Medicare Other

## 2014-11-27 ENCOUNTER — Ambulatory Visit
Admission: RE | Admit: 2014-11-27 | Discharge: 2014-11-27 | Disposition: A | Discharge status: Home / Self Care | Payer: Medicare Other | Source: Ambulatory Visit

## 2014-11-27 DIAGNOSIS — Z1231 Encounter for screening mammogram for malignant neoplasm of breast: Secondary | ICD-10-CM

## 2014-12-18 DIAGNOSIS — S46911A Strain of unspecified muscle, fascia and tendon at shoulder and upper arm level, right arm, initial encounter: Secondary | ICD-10-CM | POA: Diagnosis not present

## 2014-12-31 DIAGNOSIS — M25511 Pain in right shoulder: Secondary | ICD-10-CM | POA: Diagnosis not present

## 2014-12-31 DIAGNOSIS — M542 Cervicalgia: Secondary | ICD-10-CM | POA: Diagnosis not present

## 2015-01-08 DIAGNOSIS — Z23 Encounter for immunization: Secondary | ICD-10-CM | POA: Diagnosis not present

## 2015-01-08 DIAGNOSIS — K219 Gastro-esophageal reflux disease without esophagitis: Secondary | ICD-10-CM | POA: Diagnosis not present

## 2015-01-08 DIAGNOSIS — I10 Essential (primary) hypertension: Secondary | ICD-10-CM | POA: Diagnosis not present

## 2015-01-20 DIAGNOSIS — E042 Nontoxic multinodular goiter: Secondary | ICD-10-CM | POA: Diagnosis not present

## 2015-01-24 DIAGNOSIS — E0590 Thyrotoxicosis, unspecified without thyrotoxic crisis or storm: Secondary | ICD-10-CM | POA: Diagnosis not present

## 2015-01-24 DIAGNOSIS — E042 Nontoxic multinodular goiter: Secondary | ICD-10-CM | POA: Diagnosis not present

## 2015-02-05 DIAGNOSIS — L820 Inflamed seborrheic keratosis: Secondary | ICD-10-CM | POA: Diagnosis not present

## 2015-02-05 DIAGNOSIS — B86 Scabies: Secondary | ICD-10-CM | POA: Diagnosis not present

## 2015-02-11 DIAGNOSIS — B86 Scabies: Secondary | ICD-10-CM | POA: Diagnosis not present

## 2015-02-26 DIAGNOSIS — S80862D Insect bite (nonvenomous), left lower leg, subsequent encounter: Secondary | ICD-10-CM | POA: Diagnosis not present

## 2015-02-26 DIAGNOSIS — L258 Unspecified contact dermatitis due to other agents: Secondary | ICD-10-CM | POA: Diagnosis not present

## 2015-02-26 DIAGNOSIS — Z0189 Encounter for other specified special examinations: Secondary | ICD-10-CM | POA: Diagnosis not present

## 2015-02-26 DIAGNOSIS — S20462D Insect bite (nonvenomous) of left back wall of thorax, subsequent encounter: Secondary | ICD-10-CM | POA: Diagnosis not present

## 2015-03-10 DIAGNOSIS — L120 Bullous pemphigoid: Secondary | ICD-10-CM | POA: Diagnosis not present

## 2015-03-11 DIAGNOSIS — L120 Bullous pemphigoid: Secondary | ICD-10-CM | POA: Diagnosis not present

## 2015-03-25 DIAGNOSIS — L120 Bullous pemphigoid: Secondary | ICD-10-CM | POA: Diagnosis not present

## 2015-03-25 DIAGNOSIS — Z7952 Long term (current) use of systemic steroids: Secondary | ICD-10-CM | POA: Diagnosis not present

## 2015-03-25 DIAGNOSIS — Z5181 Encounter for therapeutic drug level monitoring: Secondary | ICD-10-CM | POA: Diagnosis not present

## 2015-03-27 DIAGNOSIS — L120 Bullous pemphigoid: Secondary | ICD-10-CM | POA: Diagnosis not present

## 2015-04-16 DIAGNOSIS — Z5181 Encounter for therapeutic drug level monitoring: Secondary | ICD-10-CM | POA: Diagnosis not present

## 2015-04-16 DIAGNOSIS — Z79899 Other long term (current) drug therapy: Secondary | ICD-10-CM | POA: Diagnosis not present

## 2015-04-16 DIAGNOSIS — L120 Bullous pemphigoid: Secondary | ICD-10-CM | POA: Diagnosis not present

## 2015-05-02 DIAGNOSIS — H04123 Dry eye syndrome of bilateral lacrimal glands: Secondary | ICD-10-CM | POA: Diagnosis not present

## 2015-05-02 DIAGNOSIS — H2513 Age-related nuclear cataract, bilateral: Secondary | ICD-10-CM | POA: Diagnosis not present

## 2015-05-20 DIAGNOSIS — L810 Postinflammatory hyperpigmentation: Secondary | ICD-10-CM | POA: Diagnosis not present

## 2015-05-20 DIAGNOSIS — D692 Other nonthrombocytopenic purpura: Secondary | ICD-10-CM | POA: Diagnosis not present

## 2015-05-20 DIAGNOSIS — Z5181 Encounter for therapeutic drug level monitoring: Secondary | ICD-10-CM | POA: Diagnosis not present

## 2015-05-20 DIAGNOSIS — L120 Bullous pemphigoid: Secondary | ICD-10-CM | POA: Diagnosis not present

## 2015-05-20 DIAGNOSIS — Z7952 Long term (current) use of systemic steroids: Secondary | ICD-10-CM | POA: Diagnosis not present

## 2015-07-15 ENCOUNTER — Other Ambulatory Visit (HOSPITAL_COMMUNITY)
Admission: RE | Admit: 2015-07-15 | Discharge: 2015-07-15 | Disposition: A | Discharge status: Home / Self Care | Payer: Medicare Other | Source: Ambulatory Visit | Attending: Family Medicine | Admitting: Family Medicine

## 2015-07-15 ENCOUNTER — Other Ambulatory Visit: Payer: Self-pay | Admitting: Family Medicine

## 2015-07-15 DIAGNOSIS — Z1389 Encounter for screening for other disorder: Secondary | ICD-10-CM | POA: Diagnosis not present

## 2015-07-15 DIAGNOSIS — Z124 Encounter for screening for malignant neoplasm of cervix: Secondary | ICD-10-CM | POA: Diagnosis present

## 2015-07-15 DIAGNOSIS — K219 Gastro-esophageal reflux disease without esophagitis: Secondary | ICD-10-CM | POA: Diagnosis not present

## 2015-07-15 DIAGNOSIS — I10 Essential (primary) hypertension: Secondary | ICD-10-CM | POA: Diagnosis not present

## 2015-07-15 DIAGNOSIS — Z0000 Encounter for general adult medical examination without abnormal findings: Secondary | ICD-10-CM | POA: Diagnosis not present

## 2015-07-16 LAB — CYTOLOGY - PAP

## 2015-07-22 DIAGNOSIS — D692 Other nonthrombocytopenic purpura: Secondary | ICD-10-CM | POA: Diagnosis not present

## 2015-07-22 DIAGNOSIS — Z5181 Encounter for therapeutic drug level monitoring: Secondary | ICD-10-CM | POA: Diagnosis not present

## 2015-07-22 DIAGNOSIS — L299 Pruritus, unspecified: Secondary | ICD-10-CM | POA: Diagnosis not present

## 2015-07-22 DIAGNOSIS — L120 Bullous pemphigoid: Secondary | ICD-10-CM | POA: Diagnosis not present

## 2015-07-22 DIAGNOSIS — Z79899 Other long term (current) drug therapy: Secondary | ICD-10-CM | POA: Diagnosis not present

## 2015-08-13 DIAGNOSIS — Z79899 Other long term (current) drug therapy: Secondary | ICD-10-CM | POA: Diagnosis not present

## 2015-08-13 DIAGNOSIS — L650 Telogen effluvium: Secondary | ICD-10-CM | POA: Diagnosis not present

## 2015-08-13 DIAGNOSIS — L120 Bullous pemphigoid: Secondary | ICD-10-CM | POA: Diagnosis not present

## 2015-08-13 DIAGNOSIS — L658 Other specified nonscarring hair loss: Secondary | ICD-10-CM | POA: Diagnosis not present

## 2015-09-30 DIAGNOSIS — L821 Other seborrheic keratosis: Secondary | ICD-10-CM | POA: Diagnosis not present

## 2015-09-30 DIAGNOSIS — L650 Telogen effluvium: Secondary | ICD-10-CM | POA: Diagnosis not present

## 2015-09-30 DIAGNOSIS — R5383 Other fatigue: Secondary | ICD-10-CM | POA: Diagnosis not present

## 2015-09-30 DIAGNOSIS — Z79899 Other long term (current) drug therapy: Secondary | ICD-10-CM | POA: Diagnosis not present

## 2015-09-30 DIAGNOSIS — L80 Vitiligo: Secondary | ICD-10-CM | POA: Diagnosis not present

## 2015-09-30 DIAGNOSIS — Z6828 Body mass index (BMI) 28.0-28.9, adult: Secondary | ICD-10-CM | POA: Diagnosis not present

## 2015-09-30 DIAGNOSIS — R635 Abnormal weight gain: Secondary | ICD-10-CM | POA: Diagnosis not present

## 2015-09-30 DIAGNOSIS — L658 Other specified nonscarring hair loss: Secondary | ICD-10-CM | POA: Diagnosis not present

## 2015-09-30 DIAGNOSIS — Z5181 Encounter for therapeutic drug level monitoring: Secondary | ICD-10-CM | POA: Diagnosis not present

## 2015-09-30 DIAGNOSIS — L720 Epidermal cyst: Secondary | ICD-10-CM | POA: Diagnosis not present

## 2015-09-30 DIAGNOSIS — T148 Other injury of unspecified body region: Secondary | ICD-10-CM | POA: Diagnosis not present

## 2015-09-30 DIAGNOSIS — L120 Bullous pemphigoid: Secondary | ICD-10-CM | POA: Diagnosis not present

## 2015-09-30 DIAGNOSIS — L299 Pruritus, unspecified: Secondary | ICD-10-CM | POA: Diagnosis not present

## 2015-09-30 DIAGNOSIS — L820 Inflamed seborrheic keratosis: Secondary | ICD-10-CM | POA: Diagnosis not present

## 2015-09-30 DIAGNOSIS — T380X5A Adverse effect of glucocorticoids and synthetic analogues, initial encounter: Secondary | ICD-10-CM | POA: Diagnosis not present

## 2015-11-26 ENCOUNTER — Other Ambulatory Visit: Payer: Self-pay | Admitting: Family Medicine

## 2015-11-26 DIAGNOSIS — Z1231 Encounter for screening mammogram for malignant neoplasm of breast: Secondary | ICD-10-CM

## 2015-12-01 ENCOUNTER — Ambulatory Visit
Admission: RE | Admit: 2015-12-01 | Discharge: 2015-12-01 | Disposition: A | Discharge status: Home / Self Care | Payer: Medicare Other | Source: Ambulatory Visit | Attending: Family Medicine | Admitting: Family Medicine

## 2015-12-01 DIAGNOSIS — Z1231 Encounter for screening mammogram for malignant neoplasm of breast: Secondary | ICD-10-CM | POA: Diagnosis not present

## 2015-12-30 DIAGNOSIS — Z7952 Long term (current) use of systemic steroids: Secondary | ICD-10-CM | POA: Diagnosis not present

## 2015-12-30 DIAGNOSIS — L80 Vitiligo: Secondary | ICD-10-CM | POA: Diagnosis not present

## 2015-12-30 DIAGNOSIS — Z79899 Other long term (current) drug therapy: Secondary | ICD-10-CM | POA: Diagnosis not present

## 2015-12-30 DIAGNOSIS — Z5181 Encounter for therapeutic drug level monitoring: Secondary | ICD-10-CM | POA: Diagnosis not present

## 2015-12-30 DIAGNOSIS — L821 Other seborrheic keratosis: Secondary | ICD-10-CM | POA: Diagnosis not present

## 2015-12-30 DIAGNOSIS — L659 Nonscarring hair loss, unspecified: Secondary | ICD-10-CM | POA: Diagnosis not present

## 2015-12-30 DIAGNOSIS — L120 Bullous pemphigoid: Secondary | ICD-10-CM | POA: Diagnosis not present

## 2015-12-30 DIAGNOSIS — L658 Other specified nonscarring hair loss: Secondary | ICD-10-CM | POA: Diagnosis not present

## 2016-01-14 DIAGNOSIS — G43909 Migraine, unspecified, not intractable, without status migrainosus: Secondary | ICD-10-CM | POA: Diagnosis not present

## 2016-01-14 DIAGNOSIS — Z23 Encounter for immunization: Secondary | ICD-10-CM | POA: Diagnosis not present

## 2016-01-14 DIAGNOSIS — M67449 Ganglion, unspecified hand: Secondary | ICD-10-CM | POA: Diagnosis not present

## 2016-01-14 DIAGNOSIS — K219 Gastro-esophageal reflux disease without esophagitis: Secondary | ICD-10-CM | POA: Diagnosis not present

## 2016-01-14 DIAGNOSIS — R05 Cough: Secondary | ICD-10-CM | POA: Diagnosis not present

## 2016-01-14 DIAGNOSIS — I10 Essential (primary) hypertension: Secondary | ICD-10-CM | POA: Diagnosis not present

## 2016-02-02 DIAGNOSIS — M67441 Ganglion, right hand: Secondary | ICD-10-CM | POA: Diagnosis not present

## 2016-02-09 DIAGNOSIS — Z5181 Encounter for therapeutic drug level monitoring: Secondary | ICD-10-CM | POA: Diagnosis not present

## 2016-02-09 DIAGNOSIS — Z79899 Other long term (current) drug therapy: Secondary | ICD-10-CM | POA: Diagnosis not present

## 2016-02-09 DIAGNOSIS — L120 Bullous pemphigoid: Secondary | ICD-10-CM | POA: Diagnosis not present

## 2016-02-09 DIAGNOSIS — L658 Other specified nonscarring hair loss: Secondary | ICD-10-CM | POA: Diagnosis not present

## 2016-02-16 ENCOUNTER — Other Ambulatory Visit: Payer: Self-pay

## 2016-02-16 DIAGNOSIS — M67441 Ganglion, right hand: Secondary | ICD-10-CM | POA: Diagnosis not present

## 2016-03-08 DIAGNOSIS — Z79899 Other long term (current) drug therapy: Secondary | ICD-10-CM | POA: Diagnosis not present

## 2016-03-08 DIAGNOSIS — L120 Bullous pemphigoid: Secondary | ICD-10-CM | POA: Diagnosis not present

## 2016-03-08 DIAGNOSIS — Z5181 Encounter for therapeutic drug level monitoring: Secondary | ICD-10-CM | POA: Diagnosis not present

## 2016-05-07 DIAGNOSIS — J069 Acute upper respiratory infection, unspecified: Secondary | ICD-10-CM | POA: Diagnosis not present

## 2016-05-07 DIAGNOSIS — J4521 Mild intermittent asthma with (acute) exacerbation: Secondary | ICD-10-CM | POA: Diagnosis not present

## 2016-06-08 DIAGNOSIS — Z79899 Other long term (current) drug therapy: Secondary | ICD-10-CM | POA: Diagnosis not present

## 2016-06-08 DIAGNOSIS — L120 Bullous pemphigoid: Secondary | ICD-10-CM | POA: Diagnosis not present

## 2016-06-08 DIAGNOSIS — Z5181 Encounter for therapeutic drug level monitoring: Secondary | ICD-10-CM | POA: Diagnosis not present

## 2016-06-30 DIAGNOSIS — L570 Actinic keratosis: Secondary | ICD-10-CM | POA: Diagnosis not present

## 2016-06-30 DIAGNOSIS — E0590 Thyrotoxicosis, unspecified without thyrotoxic crisis or storm: Secondary | ICD-10-CM | POA: Diagnosis not present

## 2016-07-21 DIAGNOSIS — M8588 Other specified disorders of bone density and structure, other site: Secondary | ICD-10-CM | POA: Diagnosis not present

## 2016-07-21 DIAGNOSIS — K219 Gastro-esophageal reflux disease without esophagitis: Secondary | ICD-10-CM | POA: Diagnosis not present

## 2016-07-21 DIAGNOSIS — Z0000 Encounter for general adult medical examination without abnormal findings: Secondary | ICD-10-CM | POA: Diagnosis not present

## 2016-07-21 DIAGNOSIS — I10 Essential (primary) hypertension: Secondary | ICD-10-CM | POA: Diagnosis not present

## 2016-07-21 DIAGNOSIS — Z23 Encounter for immunization: Secondary | ICD-10-CM | POA: Diagnosis not present

## 2016-07-21 DIAGNOSIS — Z7952 Long term (current) use of systemic steroids: Secondary | ICD-10-CM | POA: Diagnosis not present

## 2016-07-21 DIAGNOSIS — L120 Bullous pemphigoid: Secondary | ICD-10-CM | POA: Diagnosis not present

## 2016-07-27 DIAGNOSIS — S81811A Laceration without foreign body, right lower leg, initial encounter: Secondary | ICD-10-CM | POA: Diagnosis not present

## 2016-09-29 DIAGNOSIS — L120 Bullous pemphigoid: Secondary | ICD-10-CM | POA: Diagnosis not present

## 2016-09-29 DIAGNOSIS — Z79899 Other long term (current) drug therapy: Secondary | ICD-10-CM | POA: Diagnosis not present

## 2016-09-29 DIAGNOSIS — D692 Other nonthrombocytopenic purpura: Secondary | ICD-10-CM | POA: Diagnosis not present

## 2016-12-09 ENCOUNTER — Other Ambulatory Visit: Payer: Self-pay | Admitting: Family Medicine

## 2016-12-09 DIAGNOSIS — Z1231 Encounter for screening mammogram for malignant neoplasm of breast: Secondary | ICD-10-CM

## 2016-12-20 ENCOUNTER — Ambulatory Visit
Admission: RE | Admit: 2016-12-20 | Discharge: 2016-12-20 | Disposition: A | Discharge status: Home / Self Care | Payer: Medicare Other | Source: Ambulatory Visit | Attending: Family Medicine | Admitting: Family Medicine

## 2016-12-20 DIAGNOSIS — Z1231 Encounter for screening mammogram for malignant neoplasm of breast: Secondary | ICD-10-CM | POA: Diagnosis not present

## 2016-12-24 DIAGNOSIS — R197 Diarrhea, unspecified: Secondary | ICD-10-CM | POA: Diagnosis not present

## 2016-12-24 DIAGNOSIS — K52839 Microscopic colitis, unspecified: Secondary | ICD-10-CM | POA: Diagnosis not present

## 2016-12-24 DIAGNOSIS — K219 Gastro-esophageal reflux disease without esophagitis: Secondary | ICD-10-CM | POA: Diagnosis not present

## 2017-01-17 DIAGNOSIS — J069 Acute upper respiratory infection, unspecified: Secondary | ICD-10-CM | POA: Diagnosis not present

## 2017-01-17 DIAGNOSIS — J0110 Acute frontal sinusitis, unspecified: Secondary | ICD-10-CM | POA: Diagnosis not present

## 2017-01-17 DIAGNOSIS — G43909 Migraine, unspecified, not intractable, without status migrainosus: Secondary | ICD-10-CM | POA: Diagnosis not present

## 2017-01-17 DIAGNOSIS — R05 Cough: Secondary | ICD-10-CM | POA: Diagnosis not present

## 2017-01-17 DIAGNOSIS — I10 Essential (primary) hypertension: Secondary | ICD-10-CM | POA: Diagnosis not present

## 2017-01-18 ENCOUNTER — Ambulatory Visit
Admission: RE | Admit: 2017-01-18 | Discharge: 2017-01-18 | Disposition: A | Discharge status: Home / Self Care | Payer: Medicare Other | Source: Ambulatory Visit | Attending: Family Medicine | Admitting: Family Medicine

## 2017-01-18 ENCOUNTER — Other Ambulatory Visit: Payer: Self-pay | Admitting: Family Medicine

## 2017-01-18 DIAGNOSIS — R05 Cough: Secondary | ICD-10-CM

## 2017-01-18 DIAGNOSIS — R059 Cough, unspecified: Secondary | ICD-10-CM

## 2017-02-01 DIAGNOSIS — L120 Bullous pemphigoid: Secondary | ICD-10-CM | POA: Diagnosis not present

## 2017-02-01 DIAGNOSIS — L820 Inflamed seborrheic keratosis: Secondary | ICD-10-CM | POA: Diagnosis not present

## 2017-02-01 DIAGNOSIS — L658 Other specified nonscarring hair loss: Secondary | ICD-10-CM | POA: Diagnosis not present

## 2017-02-03 DIAGNOSIS — Z23 Encounter for immunization: Secondary | ICD-10-CM | POA: Diagnosis not present

## 2017-02-04 DIAGNOSIS — M25571 Pain in right ankle and joints of right foot: Secondary | ICD-10-CM | POA: Diagnosis not present

## 2017-02-07 DIAGNOSIS — M2011 Hallux valgus (acquired), right foot: Secondary | ICD-10-CM | POA: Diagnosis not present

## 2017-02-07 DIAGNOSIS — M2041 Other hammer toe(s) (acquired), right foot: Secondary | ICD-10-CM | POA: Diagnosis not present

## 2017-02-07 DIAGNOSIS — M79676 Pain in unspecified toe(s): Secondary | ICD-10-CM | POA: Diagnosis not present

## 2017-02-18 DIAGNOSIS — K219 Gastro-esophageal reflux disease without esophagitis: Secondary | ICD-10-CM | POA: Diagnosis not present

## 2017-02-25 DIAGNOSIS — H2513 Age-related nuclear cataract, bilateral: Secondary | ICD-10-CM | POA: Diagnosis not present

## 2017-02-25 DIAGNOSIS — H40012 Open angle with borderline findings, low risk, left eye: Secondary | ICD-10-CM | POA: Diagnosis not present

## 2017-03-08 DIAGNOSIS — R1310 Dysphagia, unspecified: Secondary | ICD-10-CM | POA: Diagnosis not present

## 2017-03-08 DIAGNOSIS — J392 Other diseases of pharynx: Secondary | ICD-10-CM | POA: Diagnosis not present

## 2017-03-08 DIAGNOSIS — R05 Cough: Secondary | ICD-10-CM | POA: Diagnosis not present

## 2017-03-08 DIAGNOSIS — B3781 Candidal esophagitis: Secondary | ICD-10-CM | POA: Diagnosis not present

## 2017-03-08 DIAGNOSIS — K219 Gastro-esophageal reflux disease without esophagitis: Secondary | ICD-10-CM | POA: Diagnosis not present

## 2017-04-05 DIAGNOSIS — L120 Bullous pemphigoid: Secondary | ICD-10-CM | POA: Diagnosis not present

## 2017-04-05 DIAGNOSIS — Z7189 Other specified counseling: Secondary | ICD-10-CM | POA: Diagnosis not present

## 2017-04-05 DIAGNOSIS — Z79899 Other long term (current) drug therapy: Secondary | ICD-10-CM | POA: Diagnosis not present

## 2017-04-05 DIAGNOSIS — Z5181 Encounter for therapeutic drug level monitoring: Secondary | ICD-10-CM | POA: Diagnosis not present

## 2017-04-05 DIAGNOSIS — L80 Vitiligo: Secondary | ICD-10-CM | POA: Diagnosis not present

## 2017-04-05 DIAGNOSIS — L658 Other specified nonscarring hair loss: Secondary | ICD-10-CM | POA: Diagnosis not present

## 2017-04-15 ENCOUNTER — Institutional Professional Consult (permissible substitution): Payer: Medicare Other | Admitting: Internal Medicine

## 2017-07-22 DIAGNOSIS — Z5181 Encounter for therapeutic drug level monitoring: Secondary | ICD-10-CM | POA: Diagnosis not present

## 2017-07-22 DIAGNOSIS — Z79899 Other long term (current) drug therapy: Secondary | ICD-10-CM | POA: Diagnosis not present

## 2017-07-22 DIAGNOSIS — L120 Bullous pemphigoid: Secondary | ICD-10-CM | POA: Diagnosis not present

## 2017-07-22 DIAGNOSIS — Z7189 Other specified counseling: Secondary | ICD-10-CM | POA: Diagnosis not present

## 2017-08-10 DIAGNOSIS — Z0000 Encounter for general adult medical examination without abnormal findings: Secondary | ICD-10-CM | POA: Diagnosis not present

## 2017-08-10 DIAGNOSIS — K219 Gastro-esophageal reflux disease without esophagitis: Secondary | ICD-10-CM | POA: Diagnosis not present

## 2017-08-10 DIAGNOSIS — L120 Bullous pemphigoid: Secondary | ICD-10-CM | POA: Diagnosis not present

## 2017-08-10 DIAGNOSIS — Z136 Encounter for screening for cardiovascular disorders: Secondary | ICD-10-CM | POA: Diagnosis not present

## 2017-08-10 DIAGNOSIS — F439 Reaction to severe stress, unspecified: Secondary | ICD-10-CM | POA: Diagnosis not present

## 2017-08-10 DIAGNOSIS — I10 Essential (primary) hypertension: Secondary | ICD-10-CM | POA: Diagnosis not present

## 2017-10-11 DIAGNOSIS — J069 Acute upper respiratory infection, unspecified: Secondary | ICD-10-CM | POA: Diagnosis not present

## 2017-10-11 DIAGNOSIS — H109 Unspecified conjunctivitis: Secondary | ICD-10-CM | POA: Diagnosis not present

## 2017-12-28 ENCOUNTER — Other Ambulatory Visit: Payer: Self-pay | Admitting: Family Medicine

## 2017-12-28 DIAGNOSIS — Z1231 Encounter for screening mammogram for malignant neoplasm of breast: Secondary | ICD-10-CM

## 2018-01-16 DIAGNOSIS — Z23 Encounter for immunization: Secondary | ICD-10-CM | POA: Diagnosis not present

## 2018-01-24 DIAGNOSIS — Z79899 Other long term (current) drug therapy: Secondary | ICD-10-CM | POA: Diagnosis not present

## 2018-01-24 DIAGNOSIS — L120 Bullous pemphigoid: Secondary | ICD-10-CM | POA: Diagnosis not present

## 2018-01-24 DIAGNOSIS — Z7952 Long term (current) use of systemic steroids: Secondary | ICD-10-CM | POA: Diagnosis not present

## 2018-01-24 DIAGNOSIS — Z5181 Encounter for therapeutic drug level monitoring: Secondary | ICD-10-CM | POA: Diagnosis not present

## 2018-01-31 ENCOUNTER — Ambulatory Visit
Admission: RE | Admit: 2018-01-31 | Discharge: 2018-01-31 | Disposition: A | Discharge status: Home / Self Care | Payer: Medicare Other | Source: Ambulatory Visit | Attending: Family Medicine | Admitting: Family Medicine

## 2018-01-31 DIAGNOSIS — Z1231 Encounter for screening mammogram for malignant neoplasm of breast: Secondary | ICD-10-CM | POA: Diagnosis not present

## 2018-02-07 DIAGNOSIS — L658 Other specified nonscarring hair loss: Secondary | ICD-10-CM | POA: Diagnosis not present

## 2018-02-07 DIAGNOSIS — L120 Bullous pemphigoid: Secondary | ICD-10-CM | POA: Diagnosis not present

## 2018-02-07 DIAGNOSIS — Z7952 Long term (current) use of systemic steroids: Secondary | ICD-10-CM | POA: Diagnosis not present

## 2018-02-07 DIAGNOSIS — Z5181 Encounter for therapeutic drug level monitoring: Secondary | ICD-10-CM | POA: Diagnosis not present

## 2018-02-07 DIAGNOSIS — L659 Nonscarring hair loss, unspecified: Secondary | ICD-10-CM | POA: Diagnosis not present

## 2018-02-07 DIAGNOSIS — Z79899 Other long term (current) drug therapy: Secondary | ICD-10-CM | POA: Diagnosis not present

## 2018-02-10 DIAGNOSIS — K219 Gastro-esophageal reflux disease without esophagitis: Secondary | ICD-10-CM | POA: Diagnosis not present

## 2018-02-10 DIAGNOSIS — F439 Reaction to severe stress, unspecified: Secondary | ICD-10-CM | POA: Diagnosis not present

## 2018-02-10 DIAGNOSIS — I10 Essential (primary) hypertension: Secondary | ICD-10-CM | POA: Diagnosis not present

## 2018-06-02 DIAGNOSIS — M2040 Other hammer toe(s) (acquired), unspecified foot: Secondary | ICD-10-CM | POA: Diagnosis not present

## 2018-06-02 DIAGNOSIS — M2011 Hallux valgus (acquired), right foot: Secondary | ICD-10-CM | POA: Diagnosis not present

## 2018-08-04 DIAGNOSIS — L120 Bullous pemphigoid: Secondary | ICD-10-CM | POA: Diagnosis not present

## 2018-08-04 DIAGNOSIS — L570 Actinic keratosis: Secondary | ICD-10-CM | POA: Diagnosis not present

## 2018-08-04 DIAGNOSIS — Z79899 Other long term (current) drug therapy: Secondary | ICD-10-CM | POA: Diagnosis not present

## 2018-08-04 DIAGNOSIS — Z5181 Encounter for therapeutic drug level monitoring: Secondary | ICD-10-CM | POA: Diagnosis not present

## 2018-09-13 DIAGNOSIS — G43909 Migraine, unspecified, not intractable, without status migrainosus: Secondary | ICD-10-CM | POA: Diagnosis not present

## 2018-09-13 DIAGNOSIS — I10 Essential (primary) hypertension: Secondary | ICD-10-CM | POA: Diagnosis not present

## 2018-09-13 DIAGNOSIS — K219 Gastro-esophageal reflux disease without esophagitis: Secondary | ICD-10-CM | POA: Diagnosis not present

## 2018-09-13 DIAGNOSIS — L120 Bullous pemphigoid: Secondary | ICD-10-CM | POA: Diagnosis not present

## 2018-09-13 DIAGNOSIS — Z0000 Encounter for general adult medical examination without abnormal findings: Secondary | ICD-10-CM | POA: Diagnosis not present

## 2018-09-13 DIAGNOSIS — E042 Nontoxic multinodular goiter: Secondary | ICD-10-CM | POA: Diagnosis not present

## 2018-09-13 DIAGNOSIS — Z8639 Personal history of other endocrine, nutritional and metabolic disease: Secondary | ICD-10-CM | POA: Diagnosis not present

## 2018-10-19 DIAGNOSIS — E042 Nontoxic multinodular goiter: Secondary | ICD-10-CM | POA: Diagnosis not present

## 2018-10-19 DIAGNOSIS — Z8639 Personal history of other endocrine, nutritional and metabolic disease: Secondary | ICD-10-CM | POA: Diagnosis not present

## 2018-10-19 DIAGNOSIS — I10 Essential (primary) hypertension: Secondary | ICD-10-CM | POA: Diagnosis not present

## 2018-11-10 ENCOUNTER — Other Ambulatory Visit: Payer: Self-pay | Admitting: Orthopaedic Surgery

## 2018-11-10 DIAGNOSIS — M2011 Hallux valgus (acquired), right foot: Secondary | ICD-10-CM | POA: Diagnosis not present

## 2018-11-10 DIAGNOSIS — M2040 Other hammer toe(s) (acquired), unspecified foot: Secondary | ICD-10-CM | POA: Diagnosis not present

## 2018-11-20 DIAGNOSIS — H353130 Nonexudative age-related macular degeneration, bilateral, stage unspecified: Secondary | ICD-10-CM | POA: Diagnosis not present

## 2018-12-05 DIAGNOSIS — H35372 Puckering of macula, left eye: Secondary | ICD-10-CM | POA: Diagnosis not present

## 2018-12-05 DIAGNOSIS — H35033 Hypertensive retinopathy, bilateral: Secondary | ICD-10-CM | POA: Diagnosis not present

## 2018-12-05 DIAGNOSIS — H35362 Drusen (degenerative) of macula, left eye: Secondary | ICD-10-CM | POA: Diagnosis not present

## 2018-12-05 DIAGNOSIS — H353112 Nonexudative age-related macular degeneration, right eye, intermediate dry stage: Secondary | ICD-10-CM | POA: Diagnosis not present

## 2018-12-12 ENCOUNTER — Encounter (HOSPITAL_BASED_OUTPATIENT_CLINIC_OR_DEPARTMENT_OTHER): Payer: Self-pay | Admitting: *Deleted

## 2018-12-12 ENCOUNTER — Other Ambulatory Visit: Payer: Self-pay

## 2018-12-14 DIAGNOSIS — Z23 Encounter for immunization: Secondary | ICD-10-CM | POA: Diagnosis not present

## 2018-12-15 ENCOUNTER — Other Ambulatory Visit (HOSPITAL_COMMUNITY)
Admission: RE | Admit: 2018-12-15 | Discharge: 2018-12-15 | Disposition: A | Discharge status: Home / Self Care | Payer: Medicare Other | Source: Ambulatory Visit | Attending: Orthopaedic Surgery | Admitting: Orthopaedic Surgery

## 2018-12-15 ENCOUNTER — Other Ambulatory Visit: Payer: Self-pay

## 2018-12-15 ENCOUNTER — Encounter (HOSPITAL_BASED_OUTPATIENT_CLINIC_OR_DEPARTMENT_OTHER)
Admission: RE | Admit: 2018-12-15 | Discharge: 2018-12-15 | Disposition: A | Discharge status: Home / Self Care | Payer: Medicare Other | Source: Ambulatory Visit | Attending: Orthopaedic Surgery | Admitting: Orthopaedic Surgery

## 2018-12-15 DIAGNOSIS — Z20828 Contact with and (suspected) exposure to other viral communicable diseases: Secondary | ICD-10-CM | POA: Insufficient documentation

## 2018-12-15 DIAGNOSIS — M2041 Other hammer toe(s) (acquired), right foot: Secondary | ICD-10-CM | POA: Diagnosis not present

## 2018-12-15 DIAGNOSIS — M2011 Hallux valgus (acquired), right foot: Secondary | ICD-10-CM | POA: Diagnosis not present

## 2018-12-15 DIAGNOSIS — Z01818 Encounter for other preprocedural examination: Secondary | ICD-10-CM | POA: Insufficient documentation

## 2018-12-16 LAB — NOVEL CORONAVIRUS, NAA (HOSP ORDER, SEND-OUT TO REF LAB; TAT 18-24 HRS): SARS-CoV-2, NAA: NOT DETECTED

## 2018-12-18 NOTE — Anesthesia Preprocedure Evaluation (Addendum)
Anesthesia Evaluation  Patient identified by MRN, date of birth, ID band Patient awake    Reviewed: Allergy & Precautions, NPO status , Patient's Chart, lab work & pertinent test results  History of Anesthesia Complications (+) PONV and history of anesthetic complications  Airway Mallampati: II  TM Distance: >3 FB Neck ROM: Full    Dental  (+) Dental Advisory Given, Caps   Pulmonary neg pulmonary ROS,    Pulmonary exam normal        Cardiovascular hypertension, Pt. on medications Normal cardiovascular exam     Neuro/Psych  Headaches, negative psych ROS   GI/Hepatic Neg liver ROS, GERD  Medicated and Controlled,  Endo/Other  Hyperthyroidism (no meds)  Obesity   Renal/GU negative Renal ROS     Musculoskeletal negative musculoskeletal ROS (+)   Abdominal   Peds  Hematology negative hematology ROS (+)   Anesthesia Other Findings   Reproductive/Obstetrics                            Anesthesia Physical Anesthesia Plan  ASA: II  Anesthesia Plan: General   Post-op Pain Management:  Regional for Post-op pain   Induction: Intravenous  PONV Risk Score and Plan: 4 or greater and Treatment may vary due to age or medical condition, Ondansetron and Dexamethasone  Airway Management Planned: LMA  Additional Equipment: None  Intra-op Plan:   Post-operative Plan: Extubation in OR  Informed Consent: I have reviewed the patients History and Physical, chart, labs and discussed the procedure including the risks, benefits and alternatives for the proposed anesthesia with the patient or authorized representative who has indicated his/her understanding and acceptance.     Dental advisory given  Plan Discussed with: CRNA and Anesthesiologist  Anesthesia Plan Comments:        Anesthesia Quick Evaluation

## 2018-12-19 ENCOUNTER — Encounter (HOSPITAL_BASED_OUTPATIENT_CLINIC_OR_DEPARTMENT_OTHER)
Admission: RE | Disposition: A | Discharge status: Home / Self Care | Payer: Self-pay | Source: Home / Self Care | Attending: Orthopaedic Surgery

## 2018-12-19 ENCOUNTER — Other Ambulatory Visit: Payer: Self-pay

## 2018-12-19 ENCOUNTER — Ambulatory Visit (HOSPITAL_BASED_OUTPATIENT_CLINIC_OR_DEPARTMENT_OTHER): Payer: Medicare Other | Admitting: Anesthesiology

## 2018-12-19 ENCOUNTER — Encounter (HOSPITAL_BASED_OUTPATIENT_CLINIC_OR_DEPARTMENT_OTHER): Payer: Self-pay | Admitting: Anesthesiology

## 2018-12-19 ENCOUNTER — Ambulatory Visit (HOSPITAL_BASED_OUTPATIENT_CLINIC_OR_DEPARTMENT_OTHER)
Admission: RE | Admit: 2018-12-19 | Discharge: 2018-12-19 | Disposition: A | Discharge status: Home / Self Care | Payer: Medicare Other | Attending: Orthopaedic Surgery | Admitting: Orthopaedic Surgery

## 2018-12-19 DIAGNOSIS — M2011 Hallux valgus (acquired), right foot: Secondary | ICD-10-CM | POA: Insufficient documentation

## 2018-12-19 DIAGNOSIS — Z6830 Body mass index (BMI) 30.0-30.9, adult: Secondary | ICD-10-CM | POA: Insufficient documentation

## 2018-12-19 DIAGNOSIS — M2041 Other hammer toe(s) (acquired), right foot: Secondary | ICD-10-CM | POA: Diagnosis not present

## 2018-12-19 DIAGNOSIS — E669 Obesity, unspecified: Secondary | ICD-10-CM | POA: Diagnosis not present

## 2018-12-19 DIAGNOSIS — K219 Gastro-esophageal reflux disease without esophagitis: Secondary | ICD-10-CM | POA: Insufficient documentation

## 2018-12-19 DIAGNOSIS — Z79899 Other long term (current) drug therapy: Secondary | ICD-10-CM | POA: Insufficient documentation

## 2018-12-19 DIAGNOSIS — I10 Essential (primary) hypertension: Secondary | ICD-10-CM | POA: Insufficient documentation

## 2018-12-19 DIAGNOSIS — M7741 Metatarsalgia, right foot: Secondary | ICD-10-CM | POA: Insufficient documentation

## 2018-12-19 DIAGNOSIS — Z885 Allergy status to narcotic agent status: Secondary | ICD-10-CM | POA: Diagnosis not present

## 2018-12-19 DIAGNOSIS — R51 Headache: Secondary | ICD-10-CM | POA: Diagnosis not present

## 2018-12-19 DIAGNOSIS — Z882 Allergy status to sulfonamides status: Secondary | ICD-10-CM | POA: Diagnosis not present

## 2018-12-19 DIAGNOSIS — G8918 Other acute postprocedural pain: Secondary | ICD-10-CM | POA: Diagnosis not present

## 2018-12-19 DIAGNOSIS — M2031 Hallux varus (acquired), right foot: Secondary | ICD-10-CM | POA: Diagnosis not present

## 2018-12-19 DIAGNOSIS — Z7952 Long term (current) use of systemic steroids: Secondary | ICD-10-CM | POA: Insufficient documentation

## 2018-12-19 HISTORY — PX: BUNIONECTOMY: SHX129

## 2018-12-19 HISTORY — DX: Gastro-esophageal reflux disease without esophagitis: K21.9

## 2018-12-19 HISTORY — DX: Bullous pemphigoid: L12.0

## 2018-12-19 HISTORY — DX: Headache, unspecified: R51.9

## 2018-12-19 HISTORY — DX: Other specified postprocedural states: Z98.890

## 2018-12-19 HISTORY — DX: Nausea with vomiting, unspecified: R11.2

## 2018-12-19 SURGERY — BUNIONECTOMY
Anesthesia: General | Site: Toe | Laterality: Right

## 2018-12-19 MED ORDER — ONDANSETRON HCL 4 MG/2ML IJ SOLN: 4.0000 mg | Freq: Once | INTRAMUSCULAR | Status: DC | PRN

## 2018-12-19 MED ORDER — OXYCODONE HCL 5 MG PO TABS: 5.0000 mg | ORAL_TABLET | ORAL | 0 refills | Status: AC | PRN

## 2018-12-19 MED ORDER — MIDAZOLAM HCL 2 MG/2ML IJ SOLN
INTRAMUSCULAR | Status: AC
  Filled 2018-12-19: qty 2

## 2018-12-19 MED ORDER — PROPOFOL 500 MG/50ML IV EMUL
INTRAVENOUS | Status: DC | PRN
  Administered 2018-12-19: 25 ug/kg/min via INTRAVENOUS

## 2018-12-19 MED ORDER — PROPOFOL 10 MG/ML IV BOLUS
INTRAVENOUS | Status: DC | PRN
  Administered 2018-12-19: 50 mg via INTRAVENOUS
  Administered 2018-12-19: 150 mg via INTRAVENOUS

## 2018-12-19 MED ORDER — BUPIVACAINE-EPINEPHRINE (PF) 0.5% -1:200000 IJ SOLN
INTRAMUSCULAR | Status: DC | PRN
  Administered 2018-12-19: 25 mL via PERINEURAL
  Administered 2018-12-19: 15 mL

## 2018-12-19 MED ORDER — SCOPOLAMINE 1 MG/3DAYS TD PT72: 1.0000 | MEDICATED_PATCH | Freq: Once | TRANSDERMAL | Status: DC

## 2018-12-19 MED ORDER — CEFAZOLIN SODIUM-DEXTROSE 2-4 GM/100ML-% IV SOLN
INTRAVENOUS | Status: AC
  Filled 2018-12-19: qty 100

## 2018-12-19 MED ORDER — ONDANSETRON HCL 4 MG PO TABS: 4.0000 mg | ORAL_TABLET | Freq: Three times a day (TID) | ORAL | 1 refills | Status: AC | PRN

## 2018-12-19 MED ORDER — LIDOCAINE 2% (20 MG/ML) 5 ML SYRINGE
INTRAMUSCULAR | Status: AC
  Filled 2018-12-19: qty 5

## 2018-12-19 MED ORDER — FENTANYL CITRATE (PF) 100 MCG/2ML IJ SOLN
INTRAMUSCULAR | Status: AC
  Filled 2018-12-19: qty 2

## 2018-12-19 MED ORDER — LIDOCAINE HCL (CARDIAC) PF 100 MG/5ML IV SOSY
PREFILLED_SYRINGE | INTRAVENOUS | Status: DC | PRN
  Administered 2018-12-19: 40 mg via INTRAVENOUS

## 2018-12-19 MED ORDER — FENTANYL CITRATE (PF) 100 MCG/2ML IJ SOLN
50.0000 ug | INTRAMUSCULAR | Status: AC | PRN
  Administered 2018-12-19: 50 ug via INTRAVENOUS
  Administered 2018-12-19: 25 ug via INTRAVENOUS
  Administered 2018-12-19 (×2): 50 ug via INTRAVENOUS
  Administered 2018-12-19: 25 ug via INTRAVENOUS
  Administered 2018-12-19: 50 ug via INTRAVENOUS

## 2018-12-19 MED ORDER — BUPIVACAINE HCL (PF) 0.5 % IJ SOLN
INTRAMUSCULAR | Status: AC
  Filled 2018-12-19: qty 30

## 2018-12-19 MED ORDER — CEFAZOLIN SODIUM-DEXTROSE 2-4 GM/100ML-% IV SOLN
2.0000 g | INTRAVENOUS | Status: AC
  Administered 2018-12-19: 2 g via INTRAVENOUS

## 2018-12-19 MED ORDER — OXYCODONE HCL 5 MG/5ML PO SOLN: 5.0000 mg | Freq: Once | ORAL | Status: DC | PRN

## 2018-12-19 MED ORDER — POVIDONE-IODINE 10 % EX SWAB
2.0000 "application " | Freq: Once | CUTANEOUS | Status: AC
  Administered 2018-12-19: 2 via TOPICAL

## 2018-12-19 MED ORDER — ONDANSETRON HCL 4 MG/2ML IJ SOLN
INTRAMUSCULAR | Status: DC | PRN
  Administered 2018-12-19: 4 mg via INTRAVENOUS

## 2018-12-19 MED ORDER — FENTANYL CITRATE (PF) 100 MCG/2ML IJ SOLN: 25.0000 ug | INTRAMUSCULAR | Status: DC | PRN

## 2018-12-19 MED ORDER — PROPOFOL 500 MG/50ML IV EMUL
INTRAVENOUS | Status: AC
  Filled 2018-12-19: qty 50

## 2018-12-19 MED ORDER — OXYCODONE HCL 5 MG PO TABS: 5.0000 mg | ORAL_TABLET | Freq: Once | ORAL | Status: DC | PRN

## 2018-12-19 MED ORDER — DEXAMETHASONE SODIUM PHOSPHATE 10 MG/ML IJ SOLN
INTRAMUSCULAR | Status: DC | PRN
  Administered 2018-12-19: 5 mg via INTRAVENOUS

## 2018-12-19 MED ORDER — PHENYLEPHRINE HCL (PRESSORS) 10 MG/ML IV SOLN
INTRAVENOUS | Status: DC | PRN
  Administered 2018-12-19: 80 ug via INTRAVENOUS

## 2018-12-19 MED ORDER — LACTATED RINGERS IV SOLN
INTRAVENOUS | Status: DC
  Administered 2018-12-19 (×2): via INTRAVENOUS

## 2018-12-19 MED ORDER — ONDANSETRON HCL 4 MG/2ML IJ SOLN
INTRAMUSCULAR | Status: AC
  Filled 2018-12-19: qty 2

## 2018-12-19 MED ORDER — MIDAZOLAM HCL 2 MG/2ML IJ SOLN
1.0000 mg | INTRAMUSCULAR | Status: DC | PRN
  Administered 2018-12-19: 08:00:00 1 mg via INTRAVENOUS

## 2018-12-19 MED ORDER — DEXAMETHASONE SODIUM PHOSPHATE 10 MG/ML IJ SOLN
INTRAMUSCULAR | Status: AC
  Filled 2018-12-19: qty 1

## 2018-12-19 SURGICAL SUPPLY — 84 items
APL PRP STRL LF DISP 70% ISPRP (MISCELLANEOUS) ×1
APL SKNCLS STERI-STRIP NONHPOA (GAUZE/BANDAGES/DRESSINGS)
BANDAGE ESMARK 6X9 LF (GAUZE/BANDAGES/DRESSINGS) IMPLANT
BENZOIN TINCTURE PRP APPL 2/3 (GAUZE/BANDAGES/DRESSINGS) IMPLANT
BIT DRILL PROS QC 1.5 (BIT) ×1 IMPLANT
BIT DRILL PROS QC 1.5MM (BIT) ×1
BLADE AVERAGE 25MMX9MM (BLADE) ×1
BLADE AVERAGE 25X9 (BLADE) ×1 IMPLANT
BLADE LONG MED 25X9 (BLADE) ×2 IMPLANT
BLADE LONG MED 25X9MM (BLADE) ×1
BLADE OSC/SAG .038X5.5 CUT EDG (BLADE) ×2 IMPLANT
BLADE PRESCISION 7.0X.51X18.5 (BLADE) ×3 IMPLANT
BLADE SURG 15 STRL LF DISP TIS (BLADE) ×2 IMPLANT
BLADE SURG 15 STRL SS (BLADE) ×6
BNDG CMPR 9X4 STRL LF SNTH (GAUZE/BANDAGES/DRESSINGS)
BNDG CMPR 9X6 STRL LF SNTH (GAUZE/BANDAGES/DRESSINGS)
BNDG COHESIVE 4X5 TAN STRL (GAUZE/BANDAGES/DRESSINGS) IMPLANT
BNDG CONFORM 2 STRL LF (GAUZE/BANDAGES/DRESSINGS) ×3 IMPLANT
BNDG ELASTIC 4X5.8 VLCR STR LF (GAUZE/BANDAGES/DRESSINGS) ×3 IMPLANT
BNDG ELASTIC 6X5.8 VLCR STR LF (GAUZE/BANDAGES/DRESSINGS) IMPLANT
BNDG ESMARK 4X9 LF (GAUZE/BANDAGES/DRESSINGS) IMPLANT
BNDG ESMARK 6X9 LF (GAUZE/BANDAGES/DRESSINGS)
CHLORAPREP W/TINT 26 (MISCELLANEOUS) ×3 IMPLANT
CLOSURE WOUND 1/2 X4 (GAUZE/BANDAGES/DRESSINGS)
COVER BACK TABLE REUSABLE LG (DRAPES) ×3 IMPLANT
COVER WAND RF STERILE (DRAPES) IMPLANT
CUFF TOURN SGL QUICK 34 (TOURNIQUET CUFF)
CUFF TRNQT CYL 34X4.125X (TOURNIQUET CUFF) ×1 IMPLANT
DECANTER SPIKE VIAL GLASS SM (MISCELLANEOUS) IMPLANT
DRAPE EXTREMITY T 121X128X90 (DISPOSABLE) ×3 IMPLANT
DRAPE IMP U-DRAPE 54X76 (DRAPES) ×3 IMPLANT
DRAPE OEC MINIVIEW 54X84 (DRAPES) ×3 IMPLANT
DRAPE U-SHAPE 47X51 STRL (DRAPES) ×3 IMPLANT
ELECT REM PT RETURN 9FT ADLT (ELECTROSURGICAL) ×3
ELECTRODE REM PT RTRN 9FT ADLT (ELECTROSURGICAL) ×1 IMPLANT
GAUZE SPONGE 4X4 12PLY STRL (GAUZE/BANDAGES/DRESSINGS) ×3 IMPLANT
GAUZE XEROFORM 1X8 LF (GAUZE/BANDAGES/DRESSINGS) ×3 IMPLANT
GLOVE BIO SURGEON STRL SZ 6.5 (GLOVE) ×3 IMPLANT
GLOVE BIO SURGEONS STRL SZ 6.5 (GLOVE) ×3
GLOVE BIOGEL M STRL SZ7.5 (GLOVE) ×3 IMPLANT
GLOVE BIOGEL PI IND STRL 7.0 (GLOVE) IMPLANT
GLOVE BIOGEL PI IND STRL 8 (GLOVE) ×1 IMPLANT
GLOVE BIOGEL PI INDICATOR 7.0 (GLOVE) ×8
GLOVE BIOGEL PI INDICATOR 8 (GLOVE) ×2
GOWN STRL REUS W/ TWL LRG LVL3 (GOWN DISPOSABLE) ×1 IMPLANT
GOWN STRL REUS W/ TWL XL LVL3 (GOWN DISPOSABLE) ×1 IMPLANT
GOWN STRL REUS W/TWL LRG LVL3 (GOWN DISPOSABLE) ×12
GOWN STRL REUS W/TWL XL LVL3 (GOWN DISPOSABLE) ×3
K-WIRE .035X4 (WIRE) IMPLANT
NEEDLE HYPO 22GX1.5 SAFETY (NEEDLE) IMPLANT
NS IRRIG 1000ML POUR BTL (IV SOLUTION) ×3 IMPLANT
PACK BASIN DAY SURGERY FS (CUSTOM PROCEDURE TRAY) ×3 IMPLANT
PAD CAST 4YDX4 CTTN HI CHSV (CAST SUPPLIES) ×1 IMPLANT
PADDING CAST COTTON 4X4 STRL (CAST SUPPLIES) ×3
PADDING CAST SYNTHETIC 4 (CAST SUPPLIES) ×2
PADDING CAST SYNTHETIC 4X4 STR (CAST SUPPLIES) ×1 IMPLANT
PENCIL BUTTON HOLSTER BLD 10FT (ELECTRODE) ×3 IMPLANT
SCREW CORTEX ST 2.0X12 (Screw) ×2 IMPLANT
SCREW CORTEX ST 2.0X14 (Screw) ×4 IMPLANT
SCREW CORTEX ST 2.0X16 (Screw) ×2 IMPLANT
SCREW CORTEX ST 2.0X18 (Screw) ×2 IMPLANT
SCREW CORTEX ST 2.0X20 (Screw) ×2 IMPLANT
SLEEVE SCD COMPRESS KNEE MED (MISCELLANEOUS) ×3 IMPLANT
SPLINT FIBERGLASS 4X30 (CAST SUPPLIES) IMPLANT
SPONGE LAP 18X18 RF (DISPOSABLE) ×2 IMPLANT
STOCKINETTE 6  STRL (DRAPES) ×2
STOCKINETTE 6 STRL (DRAPES) ×1 IMPLANT
STRIP CLOSURE SKIN 1/2X4 (GAUZE/BANDAGES/DRESSINGS) IMPLANT
SUCTION FRAZIER HANDLE 10FR (MISCELLANEOUS) ×2
SUCTION TUBE FRAZIER 10FR DISP (MISCELLANEOUS) ×1 IMPLANT
SUT ETHILON 3 0 PS 1 (SUTURE) ×3 IMPLANT
SUT FIBERWIRE 2-0 18 17.9 3/8 (SUTURE) ×3
SUT MNCRL AB 3-0 PS2 18 (SUTURE) ×3 IMPLANT
SUT PDS AB 2-0 CT2 27 (SUTURE) ×3 IMPLANT
SUT VIC AB 2-0 SH 27 (SUTURE)
SUT VIC AB 2-0 SH 27XBRD (SUTURE) IMPLANT
SUT VIC AB 3-0 FS2 27 (SUTURE) IMPLANT
SUTURE FIBERWR 2-0 18 17.9 3/8 (SUTURE) IMPLANT
SYR BULB 3OZ (MISCELLANEOUS) ×3 IMPLANT
SYR CONTROL 10ML LL (SYRINGE) IMPLANT
TOWEL GREEN STERILE FF (TOWEL DISPOSABLE) ×6 IMPLANT
TUBE CONNECTING 20'X1/4 (TUBING) ×1
TUBE CONNECTING 20X1/4 (TUBING) ×2 IMPLANT
UNDERPAD 30X36 HEAVY ABSORB (UNDERPADS AND DIAPERS) ×3 IMPLANT

## 2018-12-19 NOTE — Anesthesia Postprocedure Evaluation (Signed)
Anesthesia Post Note  Patient: Julia Velez  Procedure(s) Performed: RIGHT BUNION CORECTION WITH FIRST METATARSAL OSTEOTOMY, AKIN OSTEOTOMY. SECOND HAMMERTOE CORRECTION, WEIL OSTEOTOMY AND TENDON TRANSFER (Right Toe)     Patient location during evaluation: PACU Anesthesia Type: General Level of consciousness: awake and alert Pain management: pain level controlled Vital Signs Assessment: post-procedure vital signs reviewed and stable Respiratory status: spontaneous breathing, nonlabored ventilation and respiratory function stable Cardiovascular status: blood pressure returned to baseline and stable Postop Assessment: no apparent nausea or vomiting Anesthetic complications: no    Last Vitals:  Vitals:   12/19/18 1115 12/19/18 1118  BP: (!) 111/99 124/71  Pulse: 86   Resp: 12   Temp:    SpO2: 100%     Last Pain:  Vitals:   12/19/18 1115  TempSrc:   PainSc: 0-No pain                 Audry Pili

## 2018-12-19 NOTE — Discharge Instructions (Signed)
DR. [facility] POST-OP INSTRUCTIONS   Pain Management 1. The numbing medicine and your leg will last around 18 hours, take a dose of your pain medicine as soon as you feel it wearing off to avoid rebound pain. 2. Keep your foot elevated above heart level.  Make sure that your heel hangs free ('floats'). 3. Take all prescribed medication as directed. 4. If taking narcotic pain medication you may want to use an over-the-counter stool softener to avoid constipation. 5. You may take over-the-counter NSAIDs (ibuprofen, naproxen, etc.) as well as over-the-counter acetaminophen as directed on the packaging as a supplement for your pain and may also use it to wean away from the prescription medication.  Activity ? Heel WB in post operative shoe.  First Postoperative Visit 1. Your first postop visit will be at least 2 weeks after surgery.  This should be scheduled when you schedule surgery. 2. If you do not have a postoperative visit scheduled please call [phone] to schedule an appointment. 3. At the appointment your incision will be evaluated for suture removal, x-rays will be obtained if necessary.  General Instructions 1. Swelling is very common after foot and ankle surgery.  It often takes 3 months for the foot and ankle to begin to feel comfortable.  Some amount of swelling will persist for 6-12 months. 2. DO NOT change the dressing.  If there is a problem with the dressing (too tight, loose, gets wet, etc.) please contact Dr. Pollie Friar office. 3. DO NOT get the dressing wet.  For showers you can use an over-the-counter cast cover or wrap a washcloth around the top of your dressing and then cover it with a plastic bag and tape it to your leg. 4. DO NOT soak the incision (no tubs, pools, bath, etc.) until you have approval from Dr. Lucia Gaskins.  Contact Dr. Huel Cote office or go to Emergency Room if: 1. Temperature above 101 F. 2. Increasing pain that is unresponsive to pain  medication or elevation 3. Excessive redness or swelling in your foot 4. Dressing problems - excessive bloody drainage, looseness or tightness, or if dressing gets wet 5. Develop pain, swelling, warmth, or discoloration of your calf       Post Anesthesia Home Care Instructions  Activity: Get plenty of rest for the remainder of the day. A responsible individual must stay with you for 24 hours following the procedure.  For the next 24 hours, DO NOT: -Drive a car -Paediatric nurse -Drink alcoholic beverages -Take any medication unless instructed by your physician -Make any legal decisions or sign important papers.  Meals: Start with liquid foods such as gelatin or soup. Progress to regular foods as tolerated. Avoid greasy, spicy, heavy foods. If nausea and/or vomiting occur, drink only clear liquids until the nausea and/or vomiting subsides. Call your physician if vomiting continues.  Special Instructions/Symptoms: Your throat may feel dry or sore from the anesthesia or the breathing tube placed in your throat during surgery. If this causes discomfort, gargle with warm salt water. The discomfort should disappear within 24 hours.  If you had a scopolamine patch placed behind your ear for the management of post- operative nausea and/or vomiting:  1. The medication in the patch is effective for 72 hours, after which it should be removed.  Wrap patch in a tissue and discard in the trash. Wash hands thoroughly with soap and water. 2. You may remove the patch earlier than 72 hours if you experience unpleasant side effects which  may include dry mouth, dizziness or visual disturbances. 3. Avoid touching the patch. Wash your hands with soap and water after contact with the patch.        Regional Anesthesia Blocks  1. Numbness or the inability to move the "blocked" extremity may last from 3-48 hours after placement. The length of time depends on the medication injected and your individual  response to the medication. If the numbness is not going away after 48 hours, call your surgeon.  2. The extremity that is blocked will need to be protected until the numbness is gone and the  Strength has returned. Because you cannot feel it, you will need to take extra care to avoid injury. Because it may be weak, you may have difficulty moving it or using it. You may not know what position it is in without looking at it while the block is in effect.  3. For blocks in the legs and feet, returning to weight bearing and walking needs to be done carefully. You will need to wait until the numbness is entirely gone and the strength has returned. You should be able to move your leg and foot normally before you try and bear weight or walk. You will need someone to be with you when you first try to ensure you do not fall and possibly risk injury.  4. Bruising and tenderness at the needle site are common side effects and will resolve in a few days.  5. Persistent numbness or new problems with movement should be communicated to the surgeon or the [facility] [phone] [place] [phone]).     Call your surgeon if you experience:   1.  Fever over 101.0. 2.  Inability to urinate. 3.  Nausea and/or vomiting. 4.  Extreme swelling or bruising at the surgical site. 5.  Continued bleeding from the incision. 6.  Increased pain, redness or drainage from the incision. 7.  Problems related to your pain medication. 8.  Any problems and/or concerns

## 2018-12-19 NOTE — Op Note (Signed)
Julia Velez female 73 y.o. 12/19/2018  PreOperative Diagnosis: Right symptomatic hallux valgus Second hammertoe Metatarsalgia  PostOperative Diagnosis: Right symptomatic hallux valgus with first metatarsal varus Hallux valgus interphalangeus Second hammertoe Metatarsalgia  PROCEDURE: Right bunion correction with double osteotomy Second hammertoe correction with PIP arthroplasty Second MTP capsulotomy Second flexor tenotomy FDL to EDL deep tendon transfer Second metatarsal osteotomy   SURGEON: Melony Overly, MD  ASSISTANT: Marge, RNFA student  ANESTHESIA: General with peripheral nerve block  FINDINGS: Right hallux valgus with hallux valgus interphalangeus Second MTP dislocation with second metatarsal head degenerative changes Fixed PIP flexion contracture of the second PIP joint  IMPLANTS: Synthes 2.0 millimeter screws 045 and 035 K wires  INDICATIONS:73 y.o. female had symptomatic hallux valgus that was recalcitrant to conservative treatment.  She also had a second hammertoe which was rubbing in her shoes.  She did not have a history of diabetes and was a non-smoker.  She had failed conservative treatment the form of shoe modification, anti-inflammatories, icing, activity modifications and was interested in surgical intervention.  We discussed the risk, benefits alternatives of surgery which include but not limited to wound healing complications, infection, nonunion, malunion, need for further surgery as well as damage surrounding structures.  We also discussed possibility of bunion recurrence and floating toe deformity.  We discussed the possibility of toe necrosis given the level of deformity of her second toe.  We also discussed the perioperative anesthetic risks which include death and after weighing these risks he opted to proceed with surgery.  PROCEDURE: Patient was identified the preoperative holding area.  The right foot was marked by myself.  Consent was  signed by myself and the patient.  Anesthesia performed peripheral nerve block.  She is taken the operative suite placed supine the operative table.  General anesthesia was induced without difficulty.  Preoperative antibiotics were given.  A bump was placed on the right hip and bone foam was used.  The right lower extremity was prepped and draped in usual sterile fashion.  Surgical timeout was performed.  A 4 inch Esmarch tourniquet was placed about the ankle.  We began by making a longitudinal incision overlying the medial eminence in line with the first ray medially.  This was overlying the bunion.  This taken sharply down through skin and subcutaneous tissue.  Skin flaps were created dorsally and plantarly.  Care was taken to protect the dorsal cutaneous branch.  Then the capsular tissue was identified and a longitudinal incision overlying the capsular tissue was performed.  Capsular flaps were created dorsally and plantarly about the first MTP joint.  There is a rush of joint fluid at this time.  There was a large bony prominence at the first metatarsal head.  The capsular tissue was mobilized as was the first MTP joint.  Then the soft tissue was mobilized about the first metatarsal proximally to the level of the flange for the first MTP joint.  Then the sesamoids were inspected plantarly and found to be without evidence of significant cartilage wear.  Then using a Valora Corporal the lateral capsule was identified and a knife was used to incise the capsule performing a lateral release.  Then the dorsal tissue about the first MTP joint was mobilized further and a lateral release was performed dorsally as well about the lateral capsular tissue.  Then using a sagittal saw a modified scarf osteotomy was performed the first metatarsal.  Then the dorsal cortex was grabbed with a towel clip and the osteotomy site  was corrected and held provisionally with a pointed reduction forcep.  Fluoroscopy confirmed appropriate sesamoid  reduction and position of the osteotomy site.  Then three 2.0 millimeter screws were placed to fix the osteotomy.  Then the capsular tissue was excised for redundancy using a 15 blade.  Then the capsular tissue was imbricated using a pants over vest suture with 2-0 FiberWire stitch.  We then turned our attention to the second toe.  A longitudinal incision was made overlying the PIP joint to the level of the MTP joint.  This taken sharply down through skin and subcutaneous tissue.  Then the extensor digitorum longus tendon was identified as was the extensor digitorum brevis tendon was identified.  The extensor digitorum longus tendon was tenotomized distally and extensor of his digitorum brevis tendon was tenotomized proximally.  This was to allow for extensor tendon lengthening.  We then identified the MTP joint.  There was dislocation of the second toe dorsally.  Longitudinal incision was made overlying the MTP joint capsule from the capsulotomy.  Then transverse incision through the collateral ligaments was performed to release the soft tissue.  Then it was noted that there was some cartilage wear on the dorsal aspect of the second MTP joint.  There is a large bony prominence and osteophyte.  The toe was difficult to reduce and therefore a second metatarsal osteotomy was performed in an oblique fashion to allow for shortening of the metatarsal.  Then this was fixed with a 2.0 millimeter screw.  Then an oblique incision was made overlying the capsular tissue of the PIP joint.  The joint was identified in the distal aspect of the proximal phalanx and the proximal aspect of the distal phalanx was removed after release of the collateral ligaments laterally and medially.  Then the flexor tendons were identified and mobilized plantarly through the PIP joint and tenotomized.  Then a 045 and 035 K wire were placed in a anterograde and then retrograde fashion across the PIP joint performing the PIP joint arthroplasty.   Then it was noted that there was still an adequate space for adequate correction of the second toe due to hallux valgus interphalangeus.  We turned our attention back to the hallux.  The incision was extended distally to the level of the proximal phalanx.  Soft tissue flaps were created dorsally and plantarly about the proximal phalanx and using baby Hohmann retractors were able to perform an Akin osteotomy of the proximal phalanx of the hallux.  This was fixed with a 2.0 mm cannulated screw.  Then the capsular tissue was re-imbricated here and closed with a 2-0 FiberWire stitch.  We then turned our attention back to the second toe.  The tourniquet was released at this point.  The second toe was reduced at the MTP joint and the 4 5 K wire was advanced across the MTP joint to hold the reduction.  Then using a 15 blade the tissue on the medial aspect of the second proximal phalanx was mobilized and the tendon sheath for the flexor digitorum longus tendon was incised longitudinally plantar aspect of the proximal phalanx.  The tendon was retrieved using a hemostat and transferred to the dorsal aspect of the proximal phalanx to the extensor digitorum longus tendon.  A 2-0 PDS suture was used for over the repair.  This completed the deep tendon transfer portion of the case.  Then the extensor tendons were repaired with the distal aspect of the extensor digitorum brevis tendon to the proximal aspect  of the extensor digitorum longus tendon.  This was done with a foot in a dorsiflexed position.  Then the wounds were irrigated copiously with normal saline.  The deep tissue was closed with a 2-0 PDS suture.  The subcuticular tissue was closed with 3-0 Monocryl and the skin with a 3-0 nylon stitch.  Xeroform was then placed on the wounds.  A soft tissue bunion dressing was placed using it 2 inch Kling wrap.  A 4 inch Ace wrap was placed.  She tolerated procedure well.  There are no complications.  She was taken to recovery in  stable condition.  POST OPERATIVE INSTRUCTIONS: Heel weightbearing to right lower extremity Keep dressing in place until follow-up She will follow-up in 2 weeks for wound check and nonweightbearing x-rays.  Suture removal at that time if appropriate Call the office with concerns No need for DVT prophylaxis in this ambulatory patient.  TOURNIQUET TIME: One hour and 25 minutes  BLOOD LOSS:  Minimal         DRAINS: none         SPECIMEN: none       COMPLICATIONS:  * No complications entered in OR log *         Disposition: PACU - hemodynamically stable.         Condition: stable

## 2018-12-19 NOTE — Anesthesia Procedure Notes (Signed)
Procedure Name: LMA Insertion Date/Time: 12/19/2018 8:36 AM Performed by: Maryella Shivers, CRNA Pre-anesthesia Checklist: Patient identified, Emergency Drugs available, Suction available and Patient being monitored Patient Re-evaluated:Patient Re-evaluated prior to induction Oxygen Delivery Method: Circle system utilized Preoxygenation: Pre-oxygenation with 100% oxygen Induction Type: IV induction Ventilation: Mask ventilation without difficulty LMA: LMA inserted LMA Size: 4.0 Number of attempts: 1 Airway Equipment and Method: Bite block Placement Confirmation: positive ETCO2 Tube secured with: Tape Dental Injury: Teeth and Oropharynx as per pre-operative assessment

## 2018-12-19 NOTE — Anesthesia Procedure Notes (Signed)
Anesthesia Regional Block: Popliteal block   Pre-Anesthetic Checklist: ,, timeout performed, Correct Patient, Correct Site, Correct Laterality, Correct Procedure, Correct Position, site marked, Risks and benefits discussed,  Surgical consent,  Pre-op evaluation,  At surgeon's request and post-op pain management  Laterality: Right  Prep: chloraprep       Needles:  Injection technique: Single-shot  Needle Type: Echogenic Needle     Needle Length: 10cm  Needle Gauge: 21     Additional Needles:   Narrative:  Start time: 12/19/2018 7:53 AM End time: 12/19/2018 7:57 AM Injection made incrementally with aspirations every 5 mL.  Performed by: Personally  Anesthesiologist: Audry Pili, MD  Additional Notes: No pain on injection. No increased resistance to injection. Injection made in 5cc increments. Good needle visualization. Patient tolerated the procedure well.

## 2018-12-19 NOTE — Progress Notes (Signed)
Assisted Dr. Fransisco Beau with right, ultrasound guided, femoral, popliteal block. Side rails up, monitors on throughout procedure. See vital signs in flow sheet. Tolerated Procedure well.

## 2018-12-19 NOTE — Anesthesia Procedure Notes (Signed)
Anesthesia Regional Block: Adductor canal block   Pre-Anesthetic Checklist: ,, timeout performed, Correct Patient, Correct Site, Correct Laterality, Correct Procedure, Correct Position, site marked, Risks and benefits discussed,  Surgical consent,  Pre-op evaluation,  At surgeon's request and post-op pain management  Laterality: Right  Prep: chloraprep       Needles:  Injection technique: Single-shot  Needle Type: Echogenic Needle     Needle Length: 10cm  Needle Gauge: 21     Additional Needles:   Narrative:  Start time: 12/19/2018 7:48 AM End time: 12/19/2018 7:52 AM Injection made incrementally with aspirations every 5 mL.  Performed by: Personally  Anesthesiologist: Audry Pili, MD  Additional Notes: No pain on injection. No increased resistance to injection. Injection made in 5cc increments. Good needle visualization. Patient tolerated the procedure well.

## 2018-12-19 NOTE — H&P (Signed)
Julia Velez is an 73 y.o. female.   Chief Complaint: Right symptomatic bunion and second hammertoe HPI: Julia Velez is here today for surgical correction of her bunion and second hammertoe.  She has had several years of pain and difficulty with shoe wear due to her bunion and second hammertoe.  Is worsened over the past several months.  She is failed conservative treatment the form of shoe modifications, activity modifications, anti-inflammatory as well as toe spacers.  Given this she is indicated for bunion correction and second hammertoe correction.  She denies any recent fevers or chills.  She did have an emergency laparoscopy procedure recently and had some nausea afterwards but overall did well from it.  She does not have a history of diabetes and does not smoke cigarettes.  Past Medical History:  Diagnosis Date  . Bullous pemphigoid   . GERD (gastroesophageal reflux disease)   . Headache   . Hypertension   . Hyperthyroidism    no meds  . PONV (postoperative nausea and vomiting)     Past Surgical History:  Procedure Laterality Date  . LAPAROSCOPIC APPENDECTOMY N/A 07/19/2013   Procedure: APPENDECTOMY LAPAROSCOPIC;  Surgeon: Earnstine Regal, MD;  Location: [facility];  Service: General;  Laterality: N/A;    Family History  Problem Relation Age of Onset  . Breast cancer Paternal Aunt        in 10's   Social History:  reports that she has never smoked. She has never used smokeless tobacco. She reports current alcohol use. She reports that she does not use drugs.  Allergies:  Allergies  Allergen Reactions  . 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 Sulfa Antibiotics Rash  . Tramadol Rash    Medications Prior to Admission  Medication Sig Dispense Refill  . amLODipine (NORVASC) 2.5 MG tablet Take 2.5 mg by mouth daily.    . Aspirin-Salicylamide-Caffeine (BC FAST PAIN RELIEF) 650-195-33.3 MG PACK Take 1 Package by mouth as needed (headache).    . calcium carbonate  (OS-CAL - DOSED IN MG OF ELEMENTAL CALCIUM) 1250 MG tablet Take 1 tablet by mouth daily with breakfast.    . Cinnamon 500 MG capsule Take 100 mg by mouth daily.     . famotidine (PEPCID) 40 MG tablet Take 40 mg by mouth at bedtime.    . finasteride (PROSCAR) 5 MG tablet Take 5 mg by mouth daily.    . mycophenolate (CELLCEPT) 500 MG tablet Take 1,000 mg by mouth 2 (two) times daily.    Marland Kitchen olmesartan (BENICAR) 40 MG tablet Take 40 mg by mouth daily.    . predniSONE (DELTASONE) 5 MG tablet Take 5 mg by mouth daily with breakfast.    . SUMAtriptan (IMITREX) 25 MG tablet Take 25 mg by mouth every 2 (two) hours as needed for migraine or headache. May repeat in 2 hours if headache persists or recurs.      No results found for this or any previous visit (from the past 48 hour(s)). No results found.  Review of Systems  Constitutional: Negative.   HENT: Negative.   Eyes: Negative.   Respiratory: Negative.   Cardiovascular: Negative.   Gastrointestinal: Negative.   Musculoskeletal:       Right foot pain  Skin: Negative.   Neurological: Negative.   Psychiatric/Behavioral: Negative.     Blood pressure 133/75, pulse (!) 103, temperature 97.8 F (36.6 C), temperature source Oral, resp. rate 18, height 5\' 1"  (  1.549 m), weight 73.1 kg, SpO2 98 %. Physical Exam  Constitutional: She appears well-developed.  HENT:  Head: Normocephalic.  Eyes: Conjunctivae are normal.  Neck: Neck supple.  Cardiovascular: Normal rate.  Respiratory: Effort normal.  GI: Soft.  Musculoskeletal:     Comments: Right foot demonstrates bunion deformity with slight pronation of the hallux distally.  There is redness over the medial eminence.  She has tenderness palpation over the medial eminence.  There is also a hammertoe.  The second toe is overlapping the hallux.  There is a flexion contracture of the PIP joint.  She has tenderness palpation on the plantar second metatarsal head.  Other lesser toes are well aligned and  without deformity.  She endorses intact sensation on the dorsal and plantar foot.  Palpable dorsalis pedis pulse.  She has active ankle dorsiflexion plantarflexion intact.  Neurological: She is alert.  Skin: Skin is warm.  Psychiatric: She has a normal mood and affect.     Assessment/Plan We will proceed with right bunion correction with first metatarsal osteotomy and possible Akin osteotomy.  We will also perform second hammertoe correction with possible Weil osteotomy and FDL transfer if needed.  We discussed the risk benefits alternatives of surgery which include but are not limited to wound healing complications, infection, nonunion, malunion, need for further surgery and damage to surrounding structures.  We also discussed the possibility of continued pain and recurrence of the bunion and/or hammertoe.  We briefly discussed the perioperative and anesthetic risk which include death.  She understood the postoperative weightbearing and dressing restrictions.  After weighing these risks she opted to proceed with surgery.  Erle Crocker, MD 12/19/2018, 8:16 AM

## 2018-12-19 NOTE — Transfer of Care (Signed)
Immediate Anesthesia Transfer of Care Note  Patient: Julia Velez  Procedure(s) Performed: RIGHT BUNION CORECTION WITH FIRST METATARSAL OSTEOTOMY, AKIN OSTEOTOMY. SECOND HAMMERTOE CORRECTION, WEIL OSTEOTOMY AND TENDON TRANSFER (Right Toe)  Patient Location: PACU  Anesthesia Type:GA combined with regional for post-op pain  Level of Consciousness: sedated  Airway & Oxygen Therapy: Patient Spontanous Breathing and Patient connected to nasal cannula oxygen  Post-op Assessment: Report given to RN and Post -op Vital signs reviewed and stable  Post vital signs: Reviewed and stable  Last Vitals:  Vitals Value Taken Time  BP 144/78 12/19/18 1045  Temp    Pulse 50 12/19/18 1047  Resp 18 12/19/18 1047  SpO2 95 % 12/19/18 1047  Vitals shown include unvalidated device data.  Last Pain:  Vitals:   12/19/18 0707  TempSrc: Oral  PainSc: 5       Patients Stated Pain Goal: 5 ([id number])  Complications: No apparent anesthesia complications

## 2018-12-21 ENCOUNTER — Encounter (HOSPITAL_BASED_OUTPATIENT_CLINIC_OR_DEPARTMENT_OTHER): Payer: Self-pay | Admitting: Orthopaedic Surgery

## 2019-01-03 DIAGNOSIS — M2011 Hallux valgus (acquired), right foot: Secondary | ICD-10-CM | POA: Diagnosis not present

## 2019-01-10 DIAGNOSIS — H353110 Nonexudative age-related macular degeneration, right eye, stage unspecified: Secondary | ICD-10-CM | POA: Diagnosis not present

## 2019-01-10 DIAGNOSIS — H04123 Dry eye syndrome of bilateral lacrimal glands: Secondary | ICD-10-CM | POA: Diagnosis not present

## 2019-01-10 DIAGNOSIS — H35372 Puckering of macula, left eye: Secondary | ICD-10-CM | POA: Diagnosis not present

## 2019-01-10 DIAGNOSIS — H2513 Age-related nuclear cataract, bilateral: Secondary | ICD-10-CM | POA: Diagnosis not present

## 2019-01-10 DIAGNOSIS — H2589 Other age-related cataract: Secondary | ICD-10-CM | POA: Diagnosis not present

## 2019-01-10 DIAGNOSIS — Q112 Microphthalmos: Secondary | ICD-10-CM | POA: Diagnosis not present

## 2019-01-18 DIAGNOSIS — H2512 Age-related nuclear cataract, left eye: Secondary | ICD-10-CM | POA: Diagnosis not present

## 2019-01-30 DIAGNOSIS — Z5181 Encounter for therapeutic drug level monitoring: Secondary | ICD-10-CM | POA: Diagnosis not present

## 2019-01-30 DIAGNOSIS — L570 Actinic keratosis: Secondary | ICD-10-CM | POA: Diagnosis not present

## 2019-01-30 DIAGNOSIS — L120 Bullous pemphigoid: Secondary | ICD-10-CM | POA: Diagnosis not present

## 2019-01-31 DIAGNOSIS — M2011 Hallux valgus (acquired), right foot: Secondary | ICD-10-CM | POA: Diagnosis not present

## 2019-02-13 DIAGNOSIS — L658 Other specified nonscarring hair loss: Secondary | ICD-10-CM | POA: Diagnosis not present

## 2019-02-18 DIAGNOSIS — H2511 Age-related nuclear cataract, right eye: Secondary | ICD-10-CM | POA: Diagnosis not present

## 2019-02-21 DIAGNOSIS — Z7189 Other specified counseling: Secondary | ICD-10-CM | POA: Diagnosis not present

## 2019-02-21 DIAGNOSIS — I10 Essential (primary) hypertension: Secondary | ICD-10-CM | POA: Diagnosis not present

## 2019-02-21 DIAGNOSIS — K219 Gastro-esophageal reflux disease without esophagitis: Secondary | ICD-10-CM | POA: Diagnosis not present

## 2019-02-22 DIAGNOSIS — H2511 Age-related nuclear cataract, right eye: Secondary | ICD-10-CM | POA: Diagnosis not present

## 2019-07-31 DIAGNOSIS — Z79899 Other long term (current) drug therapy: Secondary | ICD-10-CM | POA: Diagnosis not present

## 2019-07-31 DIAGNOSIS — L120 Bullous pemphigoid: Secondary | ICD-10-CM | POA: Diagnosis not present

## 2019-07-31 DIAGNOSIS — Z5181 Encounter for therapeutic drug level monitoring: Secondary | ICD-10-CM | POA: Diagnosis not present

## 2019-07-31 DIAGNOSIS — L720 Epidermal cyst: Secondary | ICD-10-CM | POA: Diagnosis not present

## 2019-07-31 DIAGNOSIS — L570 Actinic keratosis: Secondary | ICD-10-CM | POA: Diagnosis not present

## 2019-07-31 DIAGNOSIS — C44729 Squamous cell carcinoma of skin of left lower limb, including hip: Secondary | ICD-10-CM | POA: Diagnosis not present

## 2019-08-16 DIAGNOSIS — D0472 Carcinoma in situ of skin of left lower limb, including hip: Secondary | ICD-10-CM | POA: Diagnosis not present

## 2019-08-16 DIAGNOSIS — C44729 Squamous cell carcinoma of skin of left lower limb, including hip: Secondary | ICD-10-CM | POA: Diagnosis not present

## 2019-08-30 DIAGNOSIS — Z4802 Encounter for removal of sutures: Secondary | ICD-10-CM | POA: Diagnosis not present

## 2019-11-16 DIAGNOSIS — Z23 Encounter for immunization: Secondary | ICD-10-CM | POA: Diagnosis not present

## 2019-11-20 ENCOUNTER — Ambulatory Visit: Payer: Self-pay

## 2019-12-04 DIAGNOSIS — H35362 Drusen (degenerative) of macula, left eye: Secondary | ICD-10-CM | POA: Diagnosis not present

## 2019-12-04 DIAGNOSIS — H353111 Nonexudative age-related macular degeneration, right eye, early dry stage: Secondary | ICD-10-CM | POA: Diagnosis not present

## 2019-12-04 DIAGNOSIS — H35033 Hypertensive retinopathy, bilateral: Secondary | ICD-10-CM | POA: Diagnosis not present

## 2019-12-04 DIAGNOSIS — H35373 Puckering of macula, bilateral: Secondary | ICD-10-CM | POA: Diagnosis not present

## 2019-12-07 ENCOUNTER — Other Ambulatory Visit: Payer: Self-pay | Admitting: Family Medicine

## 2019-12-07 DIAGNOSIS — Z1231 Encounter for screening mammogram for malignant neoplasm of breast: Secondary | ICD-10-CM

## 2020-01-01 ENCOUNTER — Other Ambulatory Visit: Payer: Self-pay

## 2020-01-01 ENCOUNTER — Ambulatory Visit
Admission: RE | Admit: 2020-01-01 | Discharge: 2020-01-01 | Disposition: A | Discharge status: Home / Self Care | Payer: Medicare Other | Source: Ambulatory Visit | Attending: Family Medicine | Admitting: Family Medicine

## 2020-01-01 DIAGNOSIS — Z1231 Encounter for screening mammogram for malignant neoplasm of breast: Secondary | ICD-10-CM

## 2020-01-04 DIAGNOSIS — Z23 Encounter for immunization: Secondary | ICD-10-CM | POA: Diagnosis not present

## 2020-02-05 DIAGNOSIS — L120 Bullous pemphigoid: Secondary | ICD-10-CM | POA: Diagnosis not present

## 2020-02-05 DIAGNOSIS — L570 Actinic keratosis: Secondary | ICD-10-CM | POA: Diagnosis not present

## 2020-02-05 DIAGNOSIS — Z79899 Other long term (current) drug therapy: Secondary | ICD-10-CM | POA: Diagnosis not present

## 2020-02-05 DIAGNOSIS — L720 Epidermal cyst: Secondary | ICD-10-CM | POA: Diagnosis not present

## 2020-02-05 DIAGNOSIS — Z5181 Encounter for therapeutic drug level monitoring: Secondary | ICD-10-CM | POA: Diagnosis not present

## 2020-02-05 DIAGNOSIS — Z85828 Personal history of other malignant neoplasm of skin: Secondary | ICD-10-CM | POA: Diagnosis not present

## 2020-02-27 DIAGNOSIS — L120 Bullous pemphigoid: Secondary | ICD-10-CM | POA: Diagnosis not present

## 2020-02-27 DIAGNOSIS — Z79899 Other long term (current) drug therapy: Secondary | ICD-10-CM | POA: Diagnosis not present

## 2020-02-27 DIAGNOSIS — L650 Telogen effluvium: Secondary | ICD-10-CM | POA: Diagnosis not present

## 2020-02-27 DIAGNOSIS — L658 Other specified nonscarring hair loss: Secondary | ICD-10-CM | POA: Diagnosis not present

## 2020-02-27 DIAGNOSIS — L80 Vitiligo: Secondary | ICD-10-CM | POA: Diagnosis not present

## 2020-03-05 DIAGNOSIS — Z882 Allergy status to sulfonamides status: Secondary | ICD-10-CM | POA: Diagnosis not present

## 2020-03-05 DIAGNOSIS — I10 Essential (primary) hypertension: Secondary | ICD-10-CM | POA: Diagnosis not present

## 2020-03-05 DIAGNOSIS — Z888 Allergy status to other drugs, medicaments and biological substances status: Secondary | ICD-10-CM | POA: Diagnosis not present

## 2020-03-05 DIAGNOSIS — G43909 Migraine, unspecified, not intractable, without status migrainosus: Secondary | ICD-10-CM | POA: Diagnosis not present

## 2020-03-05 DIAGNOSIS — L720 Epidermal cyst: Secondary | ICD-10-CM | POA: Diagnosis not present

## 2020-03-05 DIAGNOSIS — L120 Bullous pemphigoid: Secondary | ICD-10-CM | POA: Diagnosis not present

## 2020-03-17 DIAGNOSIS — L720 Epidermal cyst: Secondary | ICD-10-CM | POA: Diagnosis not present

## 2020-03-17 DIAGNOSIS — Z4802 Encounter for removal of sutures: Secondary | ICD-10-CM | POA: Diagnosis not present

## 2020-10-23 ENCOUNTER — Other Ambulatory Visit: Payer: Self-pay | Admitting: Family Medicine

## 2020-10-23 DIAGNOSIS — Z1231 Encounter for screening mammogram for malignant neoplasm of breast: Secondary | ICD-10-CM

## 2020-10-23 DIAGNOSIS — E2839 Other primary ovarian failure: Secondary | ICD-10-CM

## 2020-11-09 IMAGING — MG DIGITAL SCREENING BILAT W/ TOMO W/ CAD
8 series · 8 of 24 positions shown · non-contrast
Comparison: Previous exam(s).

CLINICAL DATA: Screening.

EXAM:
DIGITAL SCREENING BILATERAL MAMMOGRAM WITH TOMO AND CAD

[R MLO synth-2D]
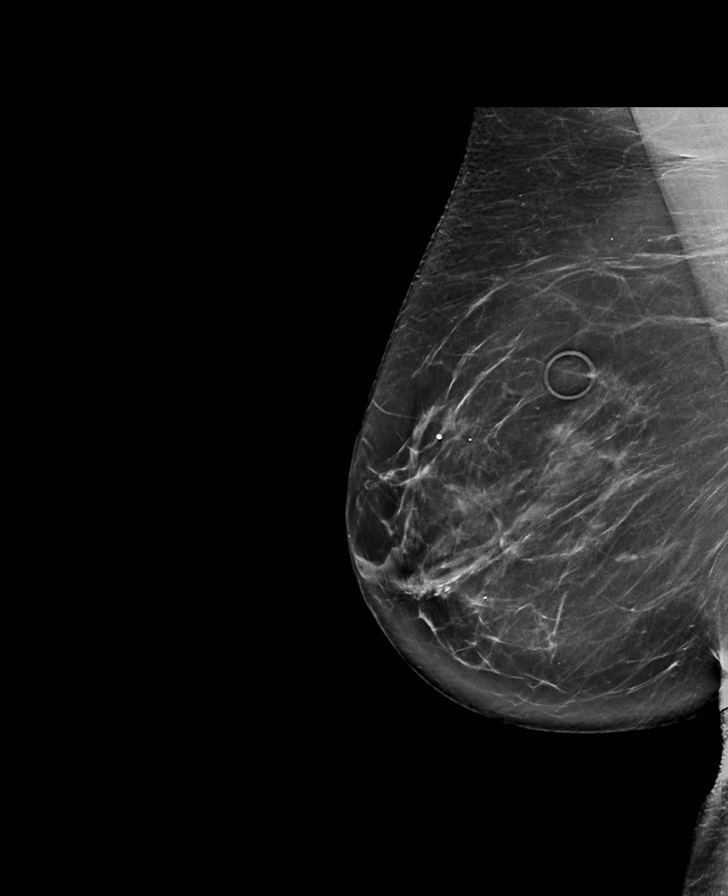

[R CC synth-2D]
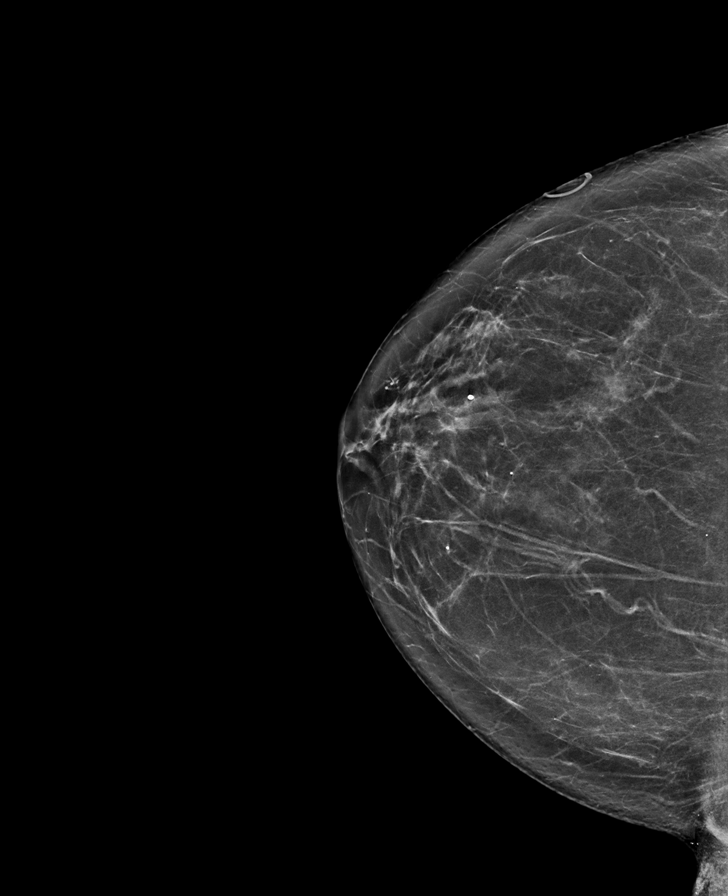

[L MLO synth-2D]
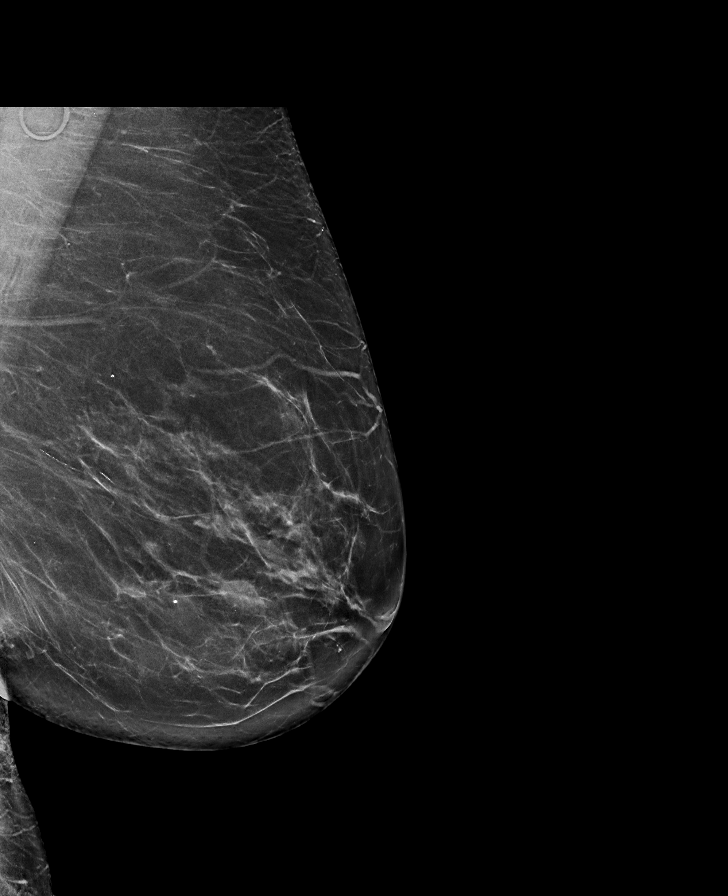

[L CC synth-2D]
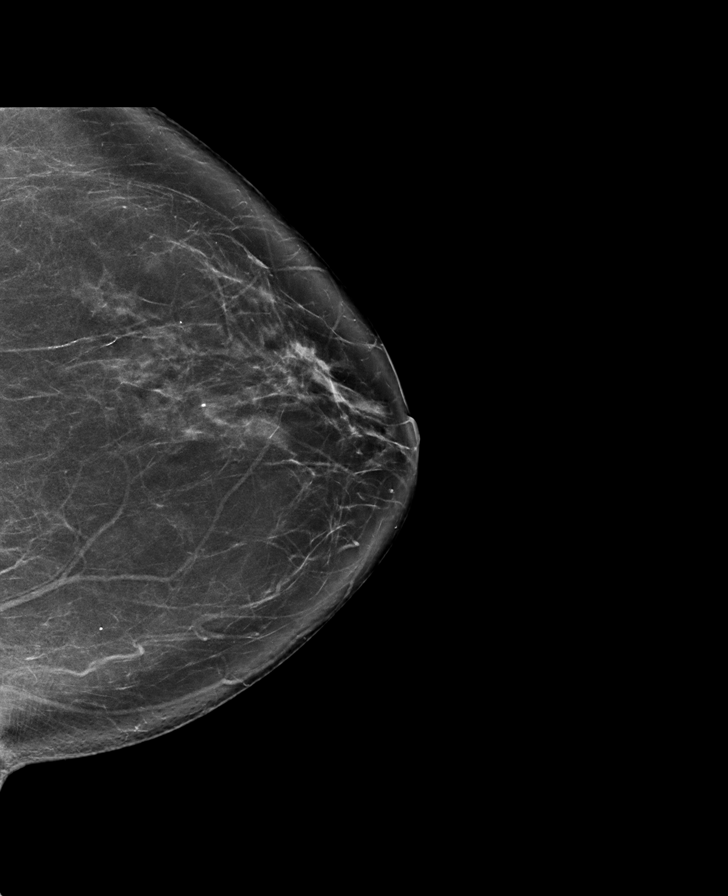

[L CC tomo · tomo slice 41/81.0]
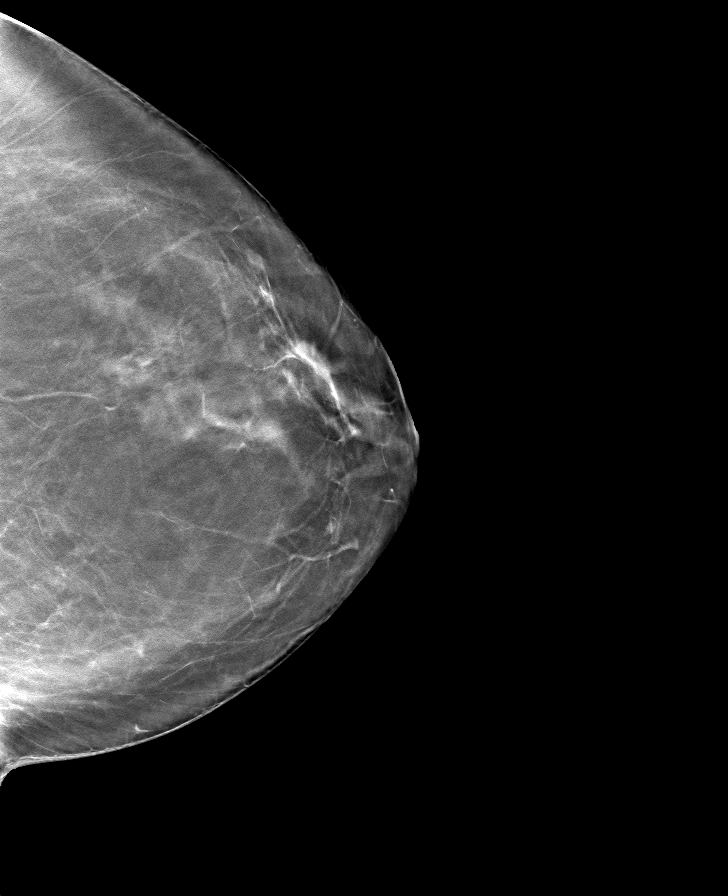

[R MLO tomo · tomo slice 45/88.0]
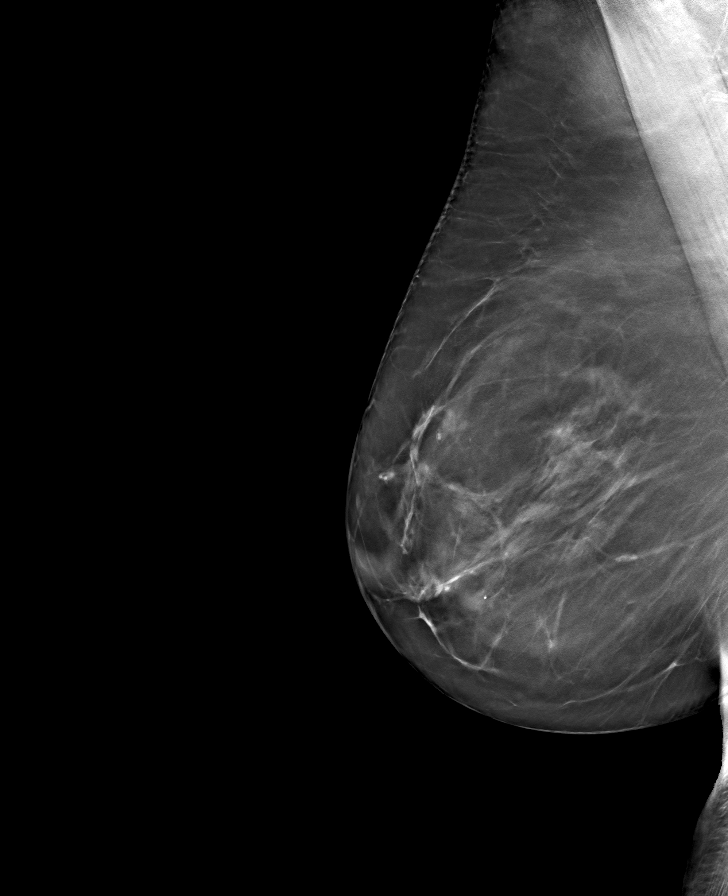

[R CC tomo · tomo slice 37/74.0]
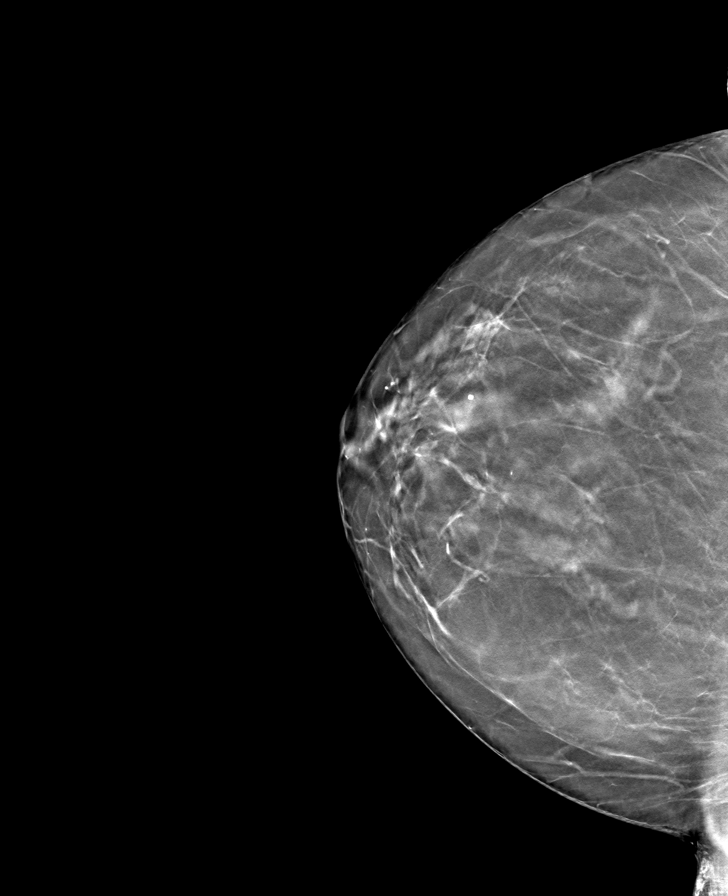

[L MLO tomo · tomo slice 41/82.0]
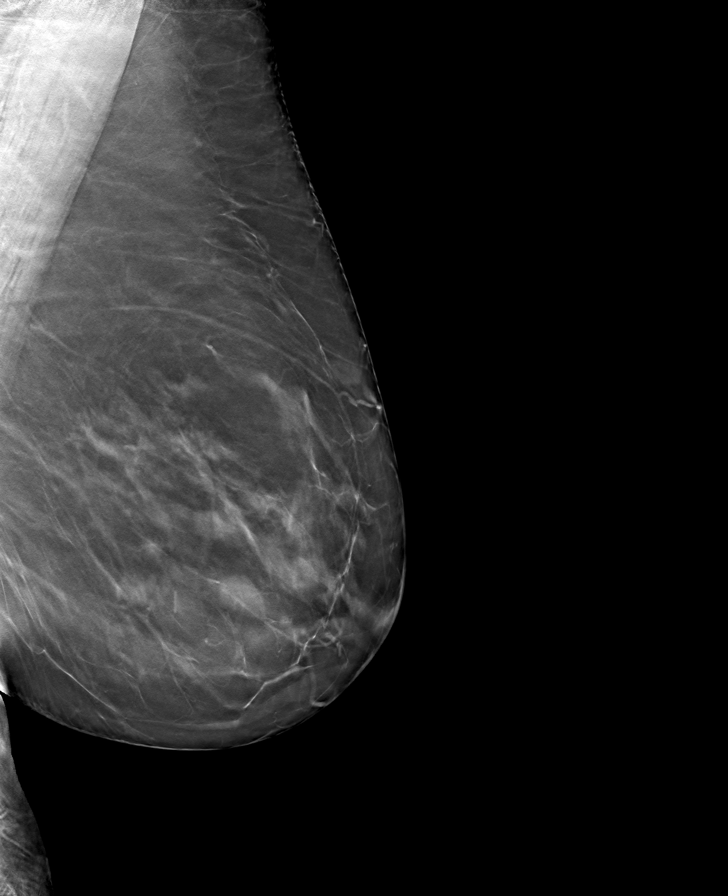

[8 of 24 positions shown; findings below may reference images not displayed]

ACR Breast Density Category b: There are scattered areas of
fibroglandular density.
FINDINGS: There are no findings suspicious for malignancy. Images were
processed with CAD.
IMPRESSION: No mammographic evidence of malignancy. A result letter of this
screening mammogram will be mailed directly to the patient.

RECOMMENDATION:
Screening mammogram in one year. (Code:[id number])

BI-RADS CATEGORY  1: Negative.

## 2021-01-02 ENCOUNTER — Ambulatory Visit
Admission: RE | Admit: 2021-01-02 | Discharge: 2021-01-02 | Disposition: A | Discharge status: Home / Self Care | Payer: Medicare Other | Source: Ambulatory Visit | Attending: Family Medicine | Admitting: Family Medicine

## 2021-01-02 ENCOUNTER — Other Ambulatory Visit: Payer: Self-pay

## 2021-01-02 DIAGNOSIS — Z1231 Encounter for screening mammogram for malignant neoplasm of breast: Secondary | ICD-10-CM

## 2021-01-20 DIAGNOSIS — M2022 Hallux rigidus, left foot: Secondary | ICD-10-CM | POA: Diagnosis not present

## 2021-01-20 DIAGNOSIS — M2012 Hallux valgus (acquired), left foot: Secondary | ICD-10-CM | POA: Diagnosis not present

## 2021-01-20 DIAGNOSIS — M2042 Other hammer toe(s) (acquired), left foot: Secondary | ICD-10-CM | POA: Diagnosis not present

## 2021-01-20 DIAGNOSIS — G8918 Other acute postprocedural pain: Secondary | ICD-10-CM | POA: Diagnosis not present

## 2021-02-04 DIAGNOSIS — M2012 Hallux valgus (acquired), left foot: Secondary | ICD-10-CM | POA: Diagnosis not present

## 2021-02-10 DIAGNOSIS — Z872 Personal history of diseases of the skin and subcutaneous tissue: Secondary | ICD-10-CM | POA: Diagnosis not present

## 2021-02-10 DIAGNOSIS — L820 Inflamed seborrheic keratosis: Secondary | ICD-10-CM | POA: Diagnosis not present

## 2021-02-10 DIAGNOSIS — Z85828 Personal history of other malignant neoplasm of skin: Secondary | ICD-10-CM | POA: Diagnosis not present

## 2021-02-10 DIAGNOSIS — L120 Bullous pemphigoid: Secondary | ICD-10-CM | POA: Diagnosis not present

## 2021-02-10 DIAGNOSIS — L658 Other specified nonscarring hair loss: Secondary | ICD-10-CM | POA: Diagnosis not present

## 2021-02-10 DIAGNOSIS — L570 Actinic keratosis: Secondary | ICD-10-CM | POA: Diagnosis not present

## 2021-02-10 DIAGNOSIS — Z79899 Other long term (current) drug therapy: Secondary | ICD-10-CM | POA: Diagnosis not present

## 2021-02-10 DIAGNOSIS — Z5181 Encounter for therapeutic drug level monitoring: Secondary | ICD-10-CM | POA: Diagnosis not present

## 2021-02-10 DIAGNOSIS — L80 Vitiligo: Secondary | ICD-10-CM | POA: Diagnosis not present

## 2021-03-03 DIAGNOSIS — L658 Other specified nonscarring hair loss: Secondary | ICD-10-CM | POA: Diagnosis not present

## 2021-03-03 DIAGNOSIS — Z872 Personal history of diseases of the skin and subcutaneous tissue: Secondary | ICD-10-CM | POA: Diagnosis not present

## 2021-03-04 DIAGNOSIS — M2012 Hallux valgus (acquired), left foot: Secondary | ICD-10-CM | POA: Diagnosis not present

## 2021-03-04 DIAGNOSIS — Z9889 Other specified postprocedural states: Secondary | ICD-10-CM | POA: Diagnosis not present

## 2021-08-11 DIAGNOSIS — Z85828 Personal history of other malignant neoplasm of skin: Secondary | ICD-10-CM | POA: Diagnosis not present

## 2021-08-11 DIAGNOSIS — L120 Bullous pemphigoid: Secondary | ICD-10-CM | POA: Diagnosis not present

## 2021-08-11 DIAGNOSIS — Z79899 Other long term (current) drug therapy: Secondary | ICD-10-CM | POA: Diagnosis not present

## 2021-08-11 DIAGNOSIS — Z5181 Encounter for therapeutic drug level monitoring: Secondary | ICD-10-CM | POA: Diagnosis not present

## 2021-10-27 DIAGNOSIS — Z23 Encounter for immunization: Secondary | ICD-10-CM | POA: Diagnosis not present

## 2021-10-27 DIAGNOSIS — L120 Bullous pemphigoid: Secondary | ICD-10-CM | POA: Diagnosis not present

## 2021-10-27 DIAGNOSIS — Z0000 Encounter for general adult medical examination without abnormal findings: Secondary | ICD-10-CM | POA: Diagnosis not present

## 2021-10-27 DIAGNOSIS — I10 Essential (primary) hypertension: Secondary | ICD-10-CM | POA: Diagnosis not present

## 2021-10-27 DIAGNOSIS — K219 Gastro-esophageal reflux disease without esophagitis: Secondary | ICD-10-CM | POA: Diagnosis not present

## 2021-10-27 DIAGNOSIS — G43909 Migraine, unspecified, not intractable, without status migrainosus: Secondary | ICD-10-CM | POA: Diagnosis not present

## 2021-12-02 DIAGNOSIS — B029 Zoster without complications: Secondary | ICD-10-CM | POA: Diagnosis not present

## 2021-12-14 DIAGNOSIS — H35362 Drusen (degenerative) of macula, left eye: Secondary | ICD-10-CM | POA: Diagnosis not present

## 2021-12-14 DIAGNOSIS — H35373 Puckering of macula, bilateral: Secondary | ICD-10-CM | POA: Diagnosis not present

## 2021-12-14 DIAGNOSIS — H353112 Nonexudative age-related macular degeneration, right eye, intermediate dry stage: Secondary | ICD-10-CM | POA: Diagnosis not present

## 2021-12-14 DIAGNOSIS — H43813 Vitreous degeneration, bilateral: Secondary | ICD-10-CM | POA: Diagnosis not present

## 2021-12-14 DIAGNOSIS — H35033 Hypertensive retinopathy, bilateral: Secondary | ICD-10-CM | POA: Diagnosis not present

## 2021-12-28 ENCOUNTER — Other Ambulatory Visit: Payer: Self-pay | Admitting: Family Medicine

## 2021-12-28 DIAGNOSIS — Z1231 Encounter for screening mammogram for malignant neoplasm of breast: Secondary | ICD-10-CM

## 2022-01-25 ENCOUNTER — Ambulatory Visit
Admission: RE | Admit: 2022-01-25 | Discharge: 2022-01-25 | Disposition: A | Discharge status: Home / Self Care | Payer: Medicare Other | Source: Ambulatory Visit | Attending: Family Medicine | Admitting: Family Medicine

## 2022-01-25 DIAGNOSIS — Z1231 Encounter for screening mammogram for malignant neoplasm of breast: Secondary | ICD-10-CM | POA: Diagnosis not present

## 2022-02-10 DIAGNOSIS — M7551 Bursitis of right shoulder: Secondary | ICD-10-CM | POA: Diagnosis not present

## 2022-02-10 DIAGNOSIS — M25511 Pain in right shoulder: Secondary | ICD-10-CM | POA: Diagnosis not present

## 2022-02-15 DIAGNOSIS — M542 Cervicalgia: Secondary | ICD-10-CM | POA: Diagnosis not present

## 2022-02-15 DIAGNOSIS — M25511 Pain in right shoulder: Secondary | ICD-10-CM | POA: Diagnosis not present

## 2022-02-16 DIAGNOSIS — M5412 Radiculopathy, cervical region: Secondary | ICD-10-CM | POA: Diagnosis not present

## 2022-02-23 DIAGNOSIS — M5412 Radiculopathy, cervical region: Secondary | ICD-10-CM | POA: Diagnosis not present

## 2022-02-24 DIAGNOSIS — Z79899 Other long term (current) drug therapy: Secondary | ICD-10-CM | POA: Diagnosis not present

## 2022-02-24 DIAGNOSIS — Z79624 Long term (current) use of inhibitors of nucleotide synthesis: Secondary | ICD-10-CM | POA: Diagnosis not present

## 2022-02-24 DIAGNOSIS — Z872 Personal history of diseases of the skin and subcutaneous tissue: Secondary | ICD-10-CM | POA: Diagnosis not present

## 2022-02-24 DIAGNOSIS — Z85828 Personal history of other malignant neoplasm of skin: Secondary | ICD-10-CM | POA: Diagnosis not present

## 2022-02-24 DIAGNOSIS — L120 Bullous pemphigoid: Secondary | ICD-10-CM | POA: Diagnosis not present

## 2022-02-24 DIAGNOSIS — L659 Nonscarring hair loss, unspecified: Secondary | ICD-10-CM | POA: Diagnosis not present

## 2022-02-24 DIAGNOSIS — Z5181 Encounter for therapeutic drug level monitoring: Secondary | ICD-10-CM | POA: Diagnosis not present

## 2022-02-24 DIAGNOSIS — L80 Vitiligo: Secondary | ICD-10-CM | POA: Diagnosis not present

## 2022-02-26 DIAGNOSIS — M5412 Radiculopathy, cervical region: Secondary | ICD-10-CM | POA: Diagnosis not present

## 2022-03-02 DIAGNOSIS — M5412 Radiculopathy, cervical region: Secondary | ICD-10-CM | POA: Diagnosis not present

## 2022-03-05 DIAGNOSIS — M5412 Radiculopathy, cervical region: Secondary | ICD-10-CM | POA: Diagnosis not present

## 2022-03-08 DIAGNOSIS — M5412 Radiculopathy, cervical region: Secondary | ICD-10-CM | POA: Diagnosis not present

## 2022-03-08 DIAGNOSIS — M542 Cervicalgia: Secondary | ICD-10-CM | POA: Diagnosis not present

## 2022-03-09 DIAGNOSIS — L658 Other specified nonscarring hair loss: Secondary | ICD-10-CM | POA: Diagnosis not present

## 2022-03-09 DIAGNOSIS — Z872 Personal history of diseases of the skin and subcutaneous tissue: Secondary | ICD-10-CM | POA: Diagnosis not present

## 2022-03-11 DIAGNOSIS — M5412 Radiculopathy, cervical region: Secondary | ICD-10-CM | POA: Diagnosis not present

## 2022-03-16 DIAGNOSIS — M5412 Radiculopathy, cervical region: Secondary | ICD-10-CM | POA: Diagnosis not present

## 2022-05-04 DIAGNOSIS — L120 Bullous pemphigoid: Secondary | ICD-10-CM | POA: Diagnosis not present

## 2022-05-04 DIAGNOSIS — I10 Essential (primary) hypertension: Secondary | ICD-10-CM | POA: Diagnosis not present

## 2022-08-10 DIAGNOSIS — L120 Bullous pemphigoid: Secondary | ICD-10-CM | POA: Diagnosis not present

## 2022-11-05 DIAGNOSIS — Z0000 Encounter for general adult medical examination without abnormal findings: Secondary | ICD-10-CM | POA: Diagnosis not present

## 2022-11-05 DIAGNOSIS — Z8639 Personal history of other endocrine, nutritional and metabolic disease: Secondary | ICD-10-CM | POA: Diagnosis not present

## 2022-11-05 DIAGNOSIS — Z1211 Encounter for screening for malignant neoplasm of colon: Secondary | ICD-10-CM | POA: Diagnosis not present

## 2022-11-05 DIAGNOSIS — E042 Nontoxic multinodular goiter: Secondary | ICD-10-CM | POA: Diagnosis not present

## 2022-11-05 DIAGNOSIS — D849 Immunodeficiency, unspecified: Secondary | ICD-10-CM | POA: Diagnosis not present

## 2022-11-05 DIAGNOSIS — G43909 Migraine, unspecified, not intractable, without status migrainosus: Secondary | ICD-10-CM | POA: Diagnosis not present

## 2022-11-05 DIAGNOSIS — I10 Essential (primary) hypertension: Secondary | ICD-10-CM | POA: Diagnosis not present

## 2022-11-05 DIAGNOSIS — K219 Gastro-esophageal reflux disease without esophagitis: Secondary | ICD-10-CM | POA: Diagnosis not present

## 2022-11-05 DIAGNOSIS — L120 Bullous pemphigoid: Secondary | ICD-10-CM | POA: Diagnosis not present

## 2022-11-10 ENCOUNTER — Other Ambulatory Visit: Payer: Self-pay | Admitting: Family Medicine

## 2022-11-10 DIAGNOSIS — E2839 Other primary ovarian failure: Secondary | ICD-10-CM

## 2022-11-29 DIAGNOSIS — H35373 Puckering of macula, bilateral: Secondary | ICD-10-CM | POA: Diagnosis not present

## 2022-11-29 DIAGNOSIS — H353132 Nonexudative age-related macular degeneration, bilateral, intermediate dry stage: Secondary | ICD-10-CM | POA: Diagnosis not present

## 2022-11-29 DIAGNOSIS — H33191 Other retinoschisis and retinal cysts, right eye: Secondary | ICD-10-CM | POA: Diagnosis not present

## 2022-11-29 DIAGNOSIS — H35033 Hypertensive retinopathy, bilateral: Secondary | ICD-10-CM | POA: Diagnosis not present

## 2022-11-29 DIAGNOSIS — H43813 Vitreous degeneration, bilateral: Secondary | ICD-10-CM | POA: Diagnosis not present

## 2022-12-06 DIAGNOSIS — Z1211 Encounter for screening for malignant neoplasm of colon: Secondary | ICD-10-CM | POA: Diagnosis not present

## 2023-01-03 ENCOUNTER — Other Ambulatory Visit: Payer: Self-pay | Admitting: Family Medicine

## 2023-01-03 DIAGNOSIS — Z1231 Encounter for screening mammogram for malignant neoplasm of breast: Secondary | ICD-10-CM

## 2023-01-13 DIAGNOSIS — H353131 Nonexudative age-related macular degeneration, bilateral, early dry stage: Secondary | ICD-10-CM | POA: Diagnosis not present

## 2023-01-28 ENCOUNTER — Ambulatory Visit
Admission: RE | Admit: 2023-01-28 | Discharge: 2023-01-28 | Disposition: A | Discharge status: Home / Self Care | Payer: Medicare Other | Source: Ambulatory Visit | Attending: Family Medicine | Admitting: Family Medicine

## 2023-01-28 DIAGNOSIS — Z1231 Encounter for screening mammogram for malignant neoplasm of breast: Secondary | ICD-10-CM | POA: Diagnosis not present

## 2023-01-31 DIAGNOSIS — L649 Androgenic alopecia, unspecified: Secondary | ICD-10-CM | POA: Diagnosis not present

## 2023-01-31 DIAGNOSIS — L80 Vitiligo: Secondary | ICD-10-CM | POA: Diagnosis not present

## 2023-01-31 DIAGNOSIS — L120 Bullous pemphigoid: Secondary | ICD-10-CM | POA: Diagnosis not present

## 2023-01-31 DIAGNOSIS — R202 Paresthesia of skin: Secondary | ICD-10-CM | POA: Diagnosis not present
